# Patient Record
Sex: Female | Born: 1985 | Hispanic: Yes | Marital: Married | Smoking: Former smoker
Health system: Southern US, Community
[De-identification: ages and names within clinical notes are randomized; demographics above are authoritative.]

## PROBLEM LIST (undated history)

## (undated) DIAGNOSIS — N179 Acute kidney failure, unspecified: Secondary | ICD-10-CM

## (undated) DIAGNOSIS — D649 Anemia, unspecified: Secondary | ICD-10-CM

## (undated) DIAGNOSIS — R519 Headache, unspecified: Secondary | ICD-10-CM

## (undated) HISTORY — PX: NO PAST SURGERIES: SHX2092

---
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
## 2013-10-31 ENCOUNTER — Emergency Department (HOSPITAL_COMMUNITY)
Admission: EM | Admit: 2013-10-31 | Discharge: 2013-11-01 | Disposition: A | Discharge status: Home / Self Care | Payer: No Typology Code available for payment source | Attending: Emergency Medicine | Admitting: Emergency Medicine

## 2013-10-31 ENCOUNTER — Encounter (HOSPITAL_COMMUNITY): Payer: Self-pay | Admitting: Emergency Medicine

## 2013-10-31 DIAGNOSIS — R109 Unspecified abdominal pain: Secondary | ICD-10-CM

## 2013-10-31 DIAGNOSIS — Z3202 Encounter for pregnancy test, result negative: Secondary | ICD-10-CM | POA: Insufficient documentation

## 2013-10-31 DIAGNOSIS — R1013 Epigastric pain: Secondary | ICD-10-CM | POA: Insufficient documentation

## 2013-10-31 LAB — COMPREHENSIVE METABOLIC PANEL
ALBUMIN: 4.5 g/dL (ref 3.5–5.2)
ALK PHOS: 97 U/L (ref 39–117)
ALT: 40 U/L — ABNORMAL HIGH (ref 0–35)
AST: 32 U/L (ref 0–37)
BILIRUBIN TOTAL: 0.2 mg/dL — AB (ref 0.3–1.2)
BUN: 9 mg/dL (ref 6–23)
CHLORIDE: 101 meq/L (ref 96–112)
CO2: 27 meq/L (ref 19–32)
CREATININE: 0.66 mg/dL (ref 0.50–1.10)
Calcium: 9.8 mg/dL (ref 8.4–10.5)
GFR calc Af Amer: >90 mL/min (ref >90)
Glucose, Bld: 126 mg/dL — ABNORMAL HIGH (ref 70–99)
POTASSIUM: 5 meq/L (ref 3.7–5.3)
Sodium: 141 mEq/L (ref 137–147)
Total Protein: 8.1 g/dL (ref 6.0–8.3)

## 2013-10-31 LAB — CBC WITH DIFFERENTIAL/PLATELET
BASOS PCT: 0 % (ref 0–1)
Basophils Absolute: 0 10*3/uL (ref 0.0–0.1)
Eosinophils Absolute: 0.4 10*3/uL (ref 0.0–0.7)
Eosinophils Relative: 5 % (ref 0–5)
HCT: 41 % (ref 36.0–46.0)
HEMOGLOBIN: 14.7 g/dL (ref 12.0–15.0)
LYMPHS PCT: 44 % (ref 12–46)
Lymphs Abs: 3.6 10*3/uL (ref 0.7–4.0)
MCH: 30.7 pg (ref 26.0–34.0)
MCHC: 35.9 g/dL (ref 30.0–36.0)
MCV: 85.6 fL (ref 78.0–100.0)
MONO ABS: 0.7 10*3/uL (ref 0.1–1.0)
MONOS PCT: 8 % (ref 3–12)
NEUTROS ABS: 3.5 10*3/uL (ref 1.7–7.7)
NEUTROS PCT: 43 % (ref 43–77)
Platelets: 286 10*3/uL (ref 150–400)
RBC: 4.79 MIL/uL (ref 3.87–5.11)
RDW: 12.3 % (ref 11.5–15.5)
WBC: 8.2 10*3/uL (ref 4.0–10.5)

## 2013-10-31 LAB — LIPASE, BLOOD: LIPASE: 35 U/L (ref 11–59)

## 2013-10-31 MED ORDER — ONDANSETRON 4 MG PO TBDP
8.0000 mg | ORAL_TABLET | Freq: Once | ORAL | Status: AC
Start: 2013-10-31 — End: 2013-10-31
  Administered 2013-10-31: 8 mg via ORAL
  Filled 2013-10-31: qty 2

## 2013-10-31 NOTE — ED Notes (Signed)
Using telephone interpreter, pt states that she is having intermitten abd pain for one hour that comes and goes with nausea.  Pt states she had a similar episode one year prior and was told it was inflammation.  Pt states that she ate meat tonight, and while it normally does upset her stomach, it is much worse tonight.

## 2013-11-01 LAB — URINALYSIS, ROUTINE W REFLEX MICROSCOPIC
Bilirubin Urine: NEGATIVE
Glucose, UA: NEGATIVE mg/dL
Hgb urine dipstick: NEGATIVE
Ketones, ur: NEGATIVE mg/dL
Nitrite: NEGATIVE
Protein, ur: NEGATIVE mg/dL
Specific Gravity, Urine: 1.024 (ref 1.005–1.030)
Urobilinogen, UA: 0.2 mg/dL (ref 0.0–1.0)
pH: 7 (ref 5.0–8.0)

## 2013-11-01 LAB — PREGNANCY, URINE: Preg Test, Ur: NEGATIVE

## 2013-11-01 LAB — URINE MICROSCOPIC-ADD ON

## 2013-11-01 MED ORDER — FAMOTIDINE 20 MG PO TABS
20.0000 mg | ORAL_TABLET | Freq: Once | ORAL | Status: AC
  Administered 2013-11-01: 20 mg via ORAL
  Filled 2013-11-01: qty 1

## 2013-11-01 MED ORDER — GI COCKTAIL ~~LOC~~
30.0000 mL | Freq: Once | ORAL | Status: AC
  Administered 2013-11-01: 30 mL via ORAL
  Filled 2013-11-01: qty 30

## 2013-11-01 MED ORDER — FAMOTIDINE 20 MG PO TABS: 20.0000 mg | ORAL_TABLET | Freq: Two times a day (BID) | ORAL | Status: DC

## 2013-11-01 NOTE — ED Provider Notes (Signed)
CSN: [id number]     Arrival date & time 10/31/13  2201 History   None    Chief Complaint  Patient presents with  . Abdominal Pain     (Consider location/radiation/quality/duration/timing/severity/associated sxs/prior Treatment) HPI Comments: 28 year old female, history of no significant past medical history, she does not take any medications regularly including anti-inflammatories. She presents with a complaint of epigastric discomfort described as a aching and illness. It started approximately 5 hours ago, is a 5/10, nothing seems to make it better, worse after eating this evening. She had similar symptoms approximately one month ago. She has never had abdominal surgery, she states that it does radiate to the back occasionally. There is no nausea vomiting diarrhea blood in the stools, no recent fevers, no coughing shortness of breath or other complaints. She denies being pregnant though she has not had a period in several months. No history of abdominal surgery  Patient is a 28 y.o. female presenting with abdominal pain. The history is provided by the patient.  Abdominal Pain   History reviewed. No pertinent past medical history. History reviewed. No pertinent past surgical history. No family history on file. History  Substance Use Topics  . Smoking status: Never Smoker   . Smokeless tobacco: Not on file  . Alcohol Use: No   OB History   Grav Para Term Preterm Abortions TAB SAB Ect Mult Living                 Review of Systems  Gastrointestinal: Positive for abdominal pain.  All other systems reviewed and are negative.      Allergies  Chicken allergy and Morphine and related  Home Medications   Current Outpatient Rx  Name  Route  Sig  Dispense  Refill  . acetaminophen (TYLENOL) 325 MG tablet   Oral   Take 650 mg by mouth every 6 (six) hours as needed for fever or headache.         . famotidine (PEPCID) 20 MG tablet   Oral   Take 1 tablet (20 mg total) by mouth 2  (two) times daily.   30 tablet   0    BP 110/75  Pulse 77  Temp(Src) 97.9 F (36.6 C) (Oral)  Resp 22  Ht 5\' 3"  (1.6 m)  Wt 145 lb (65.772 kg)  BMI 25.69 kg/m2  SpO2 100% Physical Exam  Nursing note and vitals reviewed. Constitutional: She appears well-developed and well-nourished. No distress.  HENT:  Head: Normocephalic and atraumatic.  Mouth/Throat: Oropharynx is clear and moist. No oropharyngeal exudate.  Eyes: Conjunctivae and EOM are normal. Pupils are equal, round, and reactive to light. Right eye exhibits no discharge. Left eye exhibits no discharge. No scleral icterus.  Neck: Normal range of motion. Neck supple. No JVD present. No thyromegaly present.  Cardiovascular: Normal rate, regular rhythm, normal heart sounds and intact distal pulses.  Exam reveals no gallop and no friction rub.   No murmur heard. Pulmonary/Chest: Effort normal and breath sounds normal. No respiratory distress. She has no wheezes. She has no rales.  Abdominal: Soft. Bowel sounds are normal. She exhibits no distension and no mass. There is tenderness ( Mild tenderness in the epigastrium, no fullness, no guarding, no peritoneal signs).  Musculoskeletal: Normal range of motion. She exhibits no edema and no tenderness.  Lymphadenopathy:    She has no cervical adenopathy.  Neurological: She is alert. Coordination normal.  Skin: Skin is warm and dry. No rash noted. No erythema.  Psychiatric: She  has a normal mood and affect. Her behavior is normal.    ED Course  Procedures (including critical care time) Labs Review Labs Reviewed  COMPREHENSIVE METABOLIC PANEL - Abnormal; Notable for the following:    Glucose, Bld 126 (*)    ALT 40 (*)    Total Bilirubin 0.2 (*)    All other components within normal limits  URINALYSIS, ROUTINE W REFLEX MICROSCOPIC - Abnormal; Notable for the following:    APPearance CLOUDY (*)    Leukocytes, UA LARGE (*)    All other components within normal limits  URINE  MICROSCOPIC-ADD ON - Abnormal; Notable for the following:    Squamous Epithelial / LPF MANY (*)    Bacteria, UA FEW (*)    All other components within normal limits  CBC WITH DIFFERENTIAL  LIPASE, BLOOD  PREGNANCY, URINE   Imaging Review No results found.   EKG Interpretation None      MDM   Final diagnoses:  Abdominal pain    No CVA tenderness, normal vital signs, labs are unremarkable including normal blood counts, normal compressive metabolic panel and normal lipase. Urinalysis pending  With normal labs and improvement with medications with normal vital signs there is no indication for imaging at this time. No signs of liver dysfunction, no tenderness of the right upper quadrant, urinalysis likely a contaminant, culture will be sent.  Patient formed and in agreement with plan   Meds given in ED:  Medications  ondansetron (ZOFRAN-ODT) disintegrating tablet 8 mg (8 mg Oral Given 10/31/13 2220)  gi cocktail (Maalox,Lidocaine,Donnatal) (30 mLs Oral Given 11/01/13 0146)  famotidine (PEPCID) tablet 20 mg (20 mg Oral Given 11/01/13 0146)    New Prescriptions   FAMOTIDINE (PEPCID) 20 MG TABLET    Take 1 tablet (20 mg total) by mouth 2 (two) times daily.      Vida RollerBrian D Dimples Probus, MD 11/01/13 [phone]

## 2013-11-01 NOTE — Discharge Instructions (Signed)
Tests appear normal, no abnormalities of the blood work.  Take Pepcid nightly, return to the hospital for worsening symptoms, see resource as below.   Emergency Department Resource Guide 1) Find a Doctor and Pay Out of Pocket Although you won't have to find out who is covered by your insurance plan, it is a good idea to ask around and get recommendations. You will then need to call the office and see if the doctor you have chosen will accept you as a new patient and what types of options they offer for patients who are self-pay. Some doctors offer discounts or will set up payment plans for their patients who do not have insurance, but you will need to ask so you aren't surprised when you get to your appointment.  2) Contact Your Local Health Department Not all health departments have doctors that can see patients for sick visits, but many do, so it is worth a call to see if yours does. If you don't know where your local health department is, you can check in your phone book. The CDC also has a tool to help you locate your state's health department, and many state websites also have listings of all of their local health departments.  3) Find a Walk-in Clinic If your illness is not likely to be very severe or complicated, you may want to try a walk in clinic. These are popping up all over the country in pharmacies, drugstores, and shopping centers. They're usually staffed by nurse practitioners or physician assistants that have been trained to treat common illnesses and complaints. They're usually fairly quick and inexpensive. However, if you have serious medical issues or chronic medical problems, these are probably not your best option.  No Primary Care Doctor: - Call Health Connect at  [phone] - they can help you locate a primary care doctor that  accepts your insurance, provides certain services, etc. - Physician Referral Service- [phone]  Chronic Pain Problems: Organization          Address  Phone   Notes  [facility]  [phone] Patients need to be referred by their primary care doctor.   Medication Assistance: Organization         Address  Phone   Notes  [facility] Medication [facility] [address] [place], [place] [address] [phone] --Must be a resident of [place] [facility] [place] -- Must have NO insurance coverage whatsoever (no Medicaid/ Medicare, etc.) -- The pt. MUST have a primary care doctor that directs their care regularly and follows them in the community   MedAssist  [phone]) [phone]   [employer]  [phone]    Agencies that provide inexpensive medical care: Organization         Address  Phone   Notes  [facility]  [phone]   [facility]    [phone]   [facility] [address] [place], [place] [address] [phone]   [facility] [address], [place] [phone]) [phone]   [facility]anned Parenthood    [phone]   [facility]    [phone]   [facility]  [address], [place] Phone:  [phone], Fax:  [phone]) [phone] Hours of Operation:  9 am - 6 pm, M-F.  Also accepts Medicaid/Medicare and self-pay.  [facility]  [address], [place] Phone: (  [phone], Fax: [phone]. Hours of Operation:  8:30 am - 5:30 pm, M-F.  Also accepts Medicaid and self-pay.  [facility] [place] [address], [place] Phone: [phone]   [place], [place], [place] [phone], Ext. [phone] Mondays & Thursdays: 7-9 AM.  First 15 patients are seen on a first come, first serve basis.    [place] Providers:  Organization         Address  Phone   Notes  [facility] [address], [place] [phone] Also accepts self-pay patients.  [facility] [address], [place]  [phone]   [place], [address], [place] [phone]   [facility] [address] [place] [phone]   Lucianne Lei [address], [place]   [phone] Only accepts [place] Access [place] patients after they have their name applied to their card.   Self-Pay (no insurance) in [facility]:  Organization         Address  Phone   Notes  Sickle Cell Patients, [facility] [place] [phone]   [facility] [place] [phone]   [facility] [place]  [place], [place], [place] [phone]   [facility]/Dr. Osei-Bonsu  [address] [place] or [address], [place] [phone] Phone number for both [place] and [place] locations is the same.  Urgent Medical and [facility] [address], [place] [phone]   [facility] [address] [place] or [address] [phone]   [facility] [address] [place] [phone], phone; [phone], fax Sees patients 1st and 3rd Saturday of every month.  Must not qualify for public or private insurance (i.e. Medicaid, Medicare, [place] Health Choice, Veterans' Benefits)  Household income should be no more than 200% of the poverty level The clinic cannot treat you if you are pregnant or think you are pregnant  Sexually transmitted diseases are not treated at the clinic.    Dental Care: Organization         Address  Phone  Notes  [facility] Department of [facility] [place] [phone] Accepts children up to age 28 who are enrolled in [place] or [place]; pregnant women with a Medicaid card; and  children who have applied for Medicaid or [place] Health Choice, but were declined, whose parents can pay a reduced fee at time of service.  [facility] Department of [facility]  [address], [place] [phone] Accepts children up to age 15 who are enrolled in [place] or [place]; pregnant women with a Medicaid card; and children who have applied for Medicaid or [place] Health Choice, but were declined, whose parents can pay a reduced fee at time of service.  [place] Adult [facility] Access PROGRAM  [place] [phone] Patients are seen by appointment only. Walk-ins are not accepted. [place] will see patients 11 years of age and older. Monday - Tuesday (8am-5pm) Most Wednesdays (8:30-5pm) $30 per visit, cash only  [place] [facility]  [address],  [place] [phone] Patients are seen by appointment only. Walk-ins are not accepted. [place] will see patients 54 years of age and older. One Wednesday Evening (Monthly: Volunteer Based).  $30 per visit, cash only  [place]  [phone] for adults; Children under age 55, call Graduate Pediatric Dentistry at [phone]. Children aged 23-14, please call [phone] to request a pediatric application.  Dental services are provided in all areas of dental care including fillings, crowns and bridges, complete and partial dentures, implants, gum treatment, root canals, and extractions. Preventive care is also provided. Treatment is provided to both adults and children. Patients are selected via a lottery and there is often a waiting list.   [facility] [address] [place]  [phone] [email or web address]   [facility] [address] [place], [place] [phone], Ext. [phone] Second and Fourth Thursday of each month, opens at 6:30 AM; Clinic ends at 9 AM.  Patients are seen on a first-come first-served  basis, and a limited number are seen during each clinic.   [facility]  [address] [place], [place] [phone]   Eligibility Requirements You must have lived in [place], [place], or [place] counties for at least the last three months.   You cannot be eligible for state or federal sponsored [employer], including [employer], [place], or [employer].   You generally cannot be eligible for healthcare insurance through your employer.    How to apply: Eligibility screenings are held every Tuesday and Wednesday afternoon from 1:00 pm until 4:00 pm. You do not need an appointment for the interview!  [facility] [address] [place], [place]   [place]  [place] Department  [place]  [phone]    Behavioral Health Resources in the Community: Intensive Outpatient Programs Organization         Address  Phone  Notes  [place] Hunter. [address] [place], [place] [phone]   [facility] Outpatient [address], [place], [place]   ADS: Alcohol & Drug Svcs [address], [place], [place]   [place] [address],  [place], [place] or [phone]   Substance Abuse Resources Organization         Address  Phone  Notes  Alcohol and Drug Services  [phone]   [place]  [phone]   [facility] [place]   Chinita Pester  [phone]   Residential & Outpatient Substance Abuse Program  [phone]   Psychological Services Organization         Address  Phone  Notes  [facility] [place]  [place]  [phone]   [place] [address], [place] [employer] or [phone]    Mobile Crisis Teams Organization          Address  Phone  Notes  [facility], Mobile Crisis Care Unit  [phone]   [facility]  [address] [place], [place]   [place] Levels [address], [place] [phone]    Self-Help/Support Groups Organization         Address  Phone             Notes  [place] of [place] - variety of support  groups  [phone] Call for more information  Narcotics Anonymous (NA), [facility] [address], [employer] [place]  2 meetings at this location   Residential Facilities manager         Address  Phone  Notes  [facility] [place],    [place]  [phone]   [facility]  [address], [place] [id number], [place], [place]   [place], [place] [phone] Admissions: 8am-3pm M-F  Incentives Substance [place] [address]    [place], [place] [phone]   [facility] [address] [place], [place], [place]   [facility] [address]  [place], [place]   Insight Programs - Intensive Outpatient [address], [place], [place]   [facility] [address].,  [place], [place] [phone] or [phone]   [facility] Treatment Services (RTS) [address] [place], [place] Accepts Medicaid  [facility] [place] [address]  [place] [phone] Substance Abuse/Addiction Treatment   [facility] The Organization         Address  Phone  Notes  [facility]  [phone]   Domenic Schwab, PhD [address] Arlis Porta [place], [place]   [phone] or [phone]   [place], [place] [phone]   [facility] [address] [place], [place] [phone] Insurance/Medicaid/sponsorship  through [facility] and Families [address]                                    [place], [place] [phone] [place] [address], [place] [phone]    Dr. Adele Schilder  [phone]   [facility] Dept. 1) [address] [place] 2) [place] 3)  [place] [address], [place] [phone]  [phone]   [place] [phone] or [phone] (After Hours)

## 2013-11-02 LAB — URINE CULTURE

## 2014-01-27 ENCOUNTER — Encounter: Payer: Self-pay | Admitting: *Deleted

## 2014-02-17 ENCOUNTER — Ambulatory Visit (INDEPENDENT_AMBULATORY_CARE_PROVIDER_SITE_OTHER): Payer: No Typology Code available for payment source | Admitting: Obstetrics & Gynecology

## 2014-02-17 ENCOUNTER — Encounter: Payer: Self-pay | Admitting: Obstetrics & Gynecology

## 2014-02-17 VITALS — BP 114/78 | HR 85 | Temp 98.1°F | Ht 63.0 in | Wt 141.7 lb

## 2014-02-17 DIAGNOSIS — N949 Unspecified condition associated with female genital organs and menstrual cycle: Secondary | ICD-10-CM

## 2014-02-17 DIAGNOSIS — R102 Pelvic and perineal pain: Secondary | ICD-10-CM

## 2014-02-17 NOTE — Progress Notes (Signed)
Patent here today with complaint of irregular periods and left pelvic pain. Reports being on OCP to regulate period but admits that sometimes she only takes a couple pills and stops-- then gets frustrated because she is bleeding. States when she does take the pill pack as instructed her period only comes once a month. Reports if she is sitting for a long time she feels left sided lower abdominal/pelvic pain-- constant aching pain.

## 2014-02-17 NOTE — Progress Notes (Signed)
   CLINIC ENCOUNTER NOTE  History:  28 y.o. G0 here today for evaluation of irregular bleeding in the context of occasionally skipping her OCPs. Also reports constant left sided pelvic pain.   The following portions of the patient's history were reviewed and updated as appropriate: allergies, current medications, past family history, past medical history, past social history, past surgical history and problem list.  Review of Systems:  Pertinent items are noted in HPI.  Objective:  Physical Exam BP 114/78  Pulse 85  Temp(Src) 98.1 F (36.7 C) (Oral)  Ht 5\' 3"  (1.6 m)  Wt 141 lb 11.2 oz (64.275 kg)  BMI 25.11 kg/m2  LMP 01/25/2014 Gen: NAD Abd: Soft, mild left lower abdominal tenderness, nondistended Pelvic: Normal appearing external genitalia; normal appearing vaginal mucosa and cervix.  Normal discharge.  Small uterus, no other palpable masses, no uterine tenderness but has left adnexal tenderness on palpation   Assessment & Plan:  Advised to take OCPs correctly Pelvic ultrasound ordered for evaluation of pain, will follow up results    Jaynie CollinsUGONNA  Egbert Seidel, MD, FACOG Attending Obstetrician & Gynecologist Center for [facility], [facility]

## 2014-02-17 NOTE — Patient Instructions (Signed)
Regrese a la clinica cuando tenga su cita. Si tiene problemas o preguntas, llama a la clinica o vaya a la sala de emergencia al [facility].    

## 2014-02-26 ENCOUNTER — Ambulatory Visit (HOSPITAL_COMMUNITY): Admission: RE | Admit: 2014-02-26 | Payer: No Typology Code available for payment source | Source: Ambulatory Visit

## 2014-03-10 ENCOUNTER — Encounter (HOSPITAL_COMMUNITY): Payer: Self-pay | Admitting: Emergency Medicine

## 2019-01-16 ENCOUNTER — Other Ambulatory Visit: Payer: Self-pay

## 2019-01-16 ENCOUNTER — Other Ambulatory Visit (INDEPENDENT_AMBULATORY_CARE_PROVIDER_SITE_OTHER): Payer: Self-pay

## 2019-01-16 DIAGNOSIS — Z3481 Encounter for supervision of other normal pregnancy, first trimester: Secondary | ICD-10-CM

## 2019-01-16 LAB — POCT URINALYSIS DIP (MANUAL ENTRY)
Bilirubin, UA: NEGATIVE
Glucose, UA: NEGATIVE mg/dL
Ketones, POC UA: NEGATIVE mg/dL
Leukocytes, UA: NEGATIVE
Nitrite, UA: NEGATIVE
Spec Grav, UA: 1.025 (ref 1.010–1.025)
Urobilinogen, UA: 0.2 E.U./dL
pH, UA: 7.5 (ref 5.0–8.0)

## 2019-01-16 LAB — POCT UA - MICROSCOPIC ONLY

## 2019-01-18 LAB — URINE CULTURE, OB REFLEX: Organism ID, Bacteria: NO GROWTH

## 2019-01-18 LAB — CULTURE, OB URINE

## 2019-01-19 LAB — OBSTETRIC PANEL, INCLUDING HIV
Antibody Screen: NEGATIVE
Basophils Absolute: 0 10*3/uL (ref 0.0–0.2)
Basos: 0 %
EOS (ABSOLUTE): 0.1 10*3/uL (ref 0.0–0.4)
Eos: 1 %
HIV Screen 4th Generation wRfx: NONREACTIVE
Hematocrit: 36 % (ref 34.0–46.6)
Hemoglobin: 12 g/dL (ref 11.1–15.9)
Hepatitis B Surface Ag: NEGATIVE
Immature Grans (Abs): 0.2 10*3/uL — ABNORMAL HIGH (ref 0.0–0.1)
Immature Granulocytes: 2 %
Lymphocytes Absolute: 2.3 10*3/uL (ref 0.7–3.1)
Lymphs: 22 %
MCH: 30.5 pg (ref 26.6–33.0)
MCHC: 33.3 g/dL (ref 31.5–35.7)
MCV: 91 fL (ref 79–97)
Monocytes Absolute: 0.7 10*3/uL (ref 0.1–0.9)
Monocytes: 7 %
Neutrophils Absolute: 7.5 10*3/uL — ABNORMAL HIGH (ref 1.4–7.0)
Neutrophils: 68 %
Platelets: 254 10*3/uL (ref 150–450)
RBC: 3.94 x10E6/uL (ref 3.77–5.28)
RDW: 13.1 % (ref 11.7–15.4)
RPR Ser Ql: NONREACTIVE
Rh Factor: POSITIVE
Rubella Antibodies, IGG: <0.9 index — ABNORMAL LOW (ref >0.99)
WBC: 10.9 10*3/uL — ABNORMAL HIGH (ref 3.4–10.8)

## 2019-01-19 LAB — HGB FRAC. W/SOLUBILITY
Hgb A2 Quant: 2.5 % (ref 1.8–3.2)
Hgb A: 97.5 % (ref 96.4–98.8)
Hgb C: 0 %
Hgb F Quant: 0 % (ref 0.0–2.0)
Hgb S: 0 %
Hgb Solubility: NEGATIVE
Hgb Variant: 0 %

## 2019-01-23 ENCOUNTER — Encounter: Payer: Self-pay | Admitting: Family Medicine

## 2019-01-23 ENCOUNTER — Other Ambulatory Visit (HOSPITAL_COMMUNITY)
Admission: RE | Admit: 2019-01-23 | Discharge: 2019-01-23 | Disposition: A | Discharge status: Home / Self Care | Payer: Self-pay | Source: Ambulatory Visit | Attending: Family Medicine | Admitting: Family Medicine

## 2019-01-23 ENCOUNTER — Ambulatory Visit (INDEPENDENT_AMBULATORY_CARE_PROVIDER_SITE_OTHER): Payer: Self-pay | Admitting: Family Medicine

## 2019-01-23 ENCOUNTER — Other Ambulatory Visit: Payer: Self-pay

## 2019-01-23 VITALS — BP 108/78 | HR 101 | Wt 174.8 lb

## 2019-01-23 DIAGNOSIS — Z124 Encounter for screening for malignant neoplasm of cervix: Secondary | ICD-10-CM | POA: Insufficient documentation

## 2019-01-23 NOTE — Patient Instructions (Signed)
Segundo trimestre de embarazo  Second Trimester of Pregnancy  El segundo trimestre va desde la semana14 hasta la 27, desde el cuarto hasta el sexto mes, y suele ser el momento en el que mejor se siente. Su organismo se ha adaptado a estar embarazada, y comienza a sentirse fsicamente mejor. En general, las nuseas matutinas han disminuido o han desaparecido completamente, puede tener ms energa y un aumento de apetito. El segundo trimestre es tambin la poca en la que el feto se desarrolla rpidamente. Hacia el final del sexto mes, el feto mide aproximadamente 9pulgadas (23cm) y pesa alrededor de 1 libras (700g). Es probable que sienta que el beb se mueve (da pataditas) entre las 16 y 20semanas del embarazo.  Cambios en el cuerpo durante el segundo trimestre  Su cuerpo continua experimentando numerosos cambios durante su segundo trimestre. Estos cambios varan de una mujer a otra.   Seguir aumentando de peso. Notar que la parte baja del abdomen sobresale.   Podrn aparecer las primeras estras en las caderas, el abdomen y las mamas.   Es posible que tenga dolores de cabeza que pueden aliviarse con ciertos medicamentos. Los medicamentos que tome deben estar aprobados por el mdico.   Tal vez tenga necesidad de orinar con ms frecuencia porque el feto est ejerciendo presin sobre la vejiga.   Debido al embarazo podr sentir acidez estomacal con frecuencia.   Puede estar estreida, ya que ciertas hormonas enlentecen los movimientos de los msculos que empujan los desechos a travs de los intestinos.   Pueden aparecer hemorroides o abultarse e hincharse las venas (venas varicosas).   Puede sentir dolor en la espalda. Esto se debe a:  ? Aumento de peso.  ? Las hormonas del embarazo relajan las articulaciones en la pelvis.  ? Un cambio en el peso y los msculos que ayudan a mantener su equilibrio.   Sus pechos seguirn creciendo y se pondrn cada vez ms sensibles.   Las encas pueden sangrar y estar  sensibles al cepillado y al hilo dental.   Pueden aparecer zonas oscuras o manchas (cloasma, mscara del embarazo) en el rostro. Esto probablemente se atenuar despus del nacimiento del beb.   Es posible que se forme una lnea oscura desde el ombligo hasta la zona del pubis (linea nigra). Esto probablemente se atenuar despus del nacimiento del beb.   Tal vez haya cambios en el cabello. Esto cambios pueden incluir su engrosamiento, crecimiento rpido y cambios en la textura. Adems, a algunas mujeres se les cae el cabello durante o despus del embarazo, o tienen el cabello seco o fino. Lo ms probable es que el cabello se le normalice despus del nacimiento del beb.  Qu debe esperar en las visitas prenatales  Durante una visita prenatal de rutina:   La pesarn para asegurarse de que usted y el feto estn creciendo normalmente.   Le tomarn la presin arterial.   Le medirn el abdomen para controlar el desarrollo del beb.   Se escucharn los latidos cardacos fetales.   Se evaluarn los resultados de los estudios solicitados en visitas anteriores.  El mdico puede preguntarle lo siguiente:   Cmo se siente.   Si siente los movimientos del beb.   Si ha tenido sntomas anormales, como prdida de lquido, sangrado, dolores de cabeza intensos o clicos abdominales.   Si est consumiendo algn producto que contenga tabaco, como cigarrillos, tabaco de mascar y cigarrillos electrnicos.   Si tiene alguna pregunta.  Otros estudios que podrn realizarse durante el   segundo trimestre incluyen lo siguiente:   Anlisis de sangre para detectar lo siguiente:  ? Concentraciones de hierro bajas (anemia).  ? Nivel alto de azcar en la sangre que afecta a las mujeres embarazadas (diabetes gestacional) entre las semanas 24 y 28.  ? Anticuerpos Rh. Esto es para detectar una protena en los glbulos rojos (factor Rh).   Anlisis de orina para detectar infecciones, diabetes o protenas en la orina.   Una ecografa  para confirmar que el beb crece y se desarrolla correctamente.   Una amniocentesis para diagnosticar posibles problemas genticos.   Estudios del feto para descartar espina bfida y sndrome de Down.   Prueba del VIH (virus de inmunodeficiencia humana). Los exmenes prenatales de rutina incluyen la prueba de deteccin del VIH, a menos que decida no realizrsela.  Siga estas indicaciones en su casa:  Medicamentos   Siga las indicaciones del mdico en relacin con el uso de medicamentos. Durante el embarazo, hay medicamentos que pueden tomarse y otros que no.   Tome vitaminas prenatales que contengan por lo menos 600microgramos (?g) de cido flico.   Si est estreida, tome un laxante suave, si el mdico lo autoriza.  Qu debe comer y beber     Lleve una dieta equilibrada que incluya gran cantidad de frutas y verduras frescas, cereales integrales, buenas fuentes de protenas como carnes magras, huevos o tofu, y lcteos descremados. El mdico la ayudar a determinar la cantidad de peso que puede aumentar.   No coma carne cruda ni quesos sin cocinar. Estos elementos contienen grmenes que pueden causar defectos congnitos en el beb.   Si no consume muchos alimentos con calcio, hable con su mdico sobre si debera tomar un suplemento diario de calcio.   Limite el consumo de alimentos con alto contenido de grasas y azcares procesados, como alimentos fritos o dulces.   Para evitar el estreimiento:  ? Bebe suficiente lquido para mantener la orina clara o de color amarillo plido.  ? Consuma alimentos ricos en fibra, como frutas y verduras frescas, cereales integrales y frijoles.  Actividad   Haga ejercicio solamente como se lo haya indicado el mdico. La mayora de las mujeres pueden continuar su rutina de ejercicios durante el embarazo. Intente realizar como mnimo 30minutos de actividad fsica por lo menos 5das a la semana. Deje de hacer ejercicio si experimenta contracciones uterinas.   No levante  objetos pesados, use zapatos de tacones bajos y mantenga una buena postura.   Puede seguir manteniendo relaciones sexuales, a menos que el mdico le indique lo contrario.  Alivio del dolor y del malestar   Use un sostn que le brinde buen soporte para prevenir las molestias causadas por la sensibilidad en los pechos.   Dese baos de asiento con agua tibia para aliviar el dolor o las molestias causadas por las hemorroides. Use una crema para las hemorroides si el mdico la autoriza.   Descanse con las piernas elevadas si tiene calambres o dolor de cintura.   Si tiene venas varicosas, use medias de descanso. Eleve los pies durante 15minutos, 3 o 4veces por da. Limite el consumo de sal en su dieta.  Cuidados prenatales   Escriba sus preguntas. Llvelas cuando concurra a las visitas prenatales.   Concurra a todas las visitas prenatales tal como se lo haya indicado el mdico. Esto es importante.  Seguridad   Use el cinturn de seguridad en todo momento mientras conduce.   Haga una lista de los nmeros de telfono de emergencia, que   incluya los nmeros de telfono de familiares, amigos, el hospital y los departamentos de polica y bomberos.  Instrucciones generales   Pdale al mdico que la derive a clases de educacin prenatal en su localidad. Debe comenzar a tomar las clases antes de que empiece el mes6 de embarazo.   Pida ayuda si tiene necesidades nutricionales o de asesoramiento durante el embarazo. El mdico puede aconsejarla o derivarla a especialistas para que la ayuden con diferentes necesidades.   No se d baos de inmersin en agua caliente, baos turcos ni saunas.   No se haga duchas vaginales ni use tampones o toallas higinicas perfumadas.   No mantenga las piernas cruzadas durante mucho tiempo.   Evite el contacto con las bandejas sanitarias de los gatos y la tierra que estos animales usan. Estos elementos contienen bacterias que pueden causar defectos congnitos al beb y la posible  prdida del feto debido a un aborto espontneo o muerte fetal.   Evite fumar, consumir hierbas, beber alcohol y tomar frmacos que no le hayan recetado. Las sustancias qumicas que estos productos contienen pueden afectar la formacin y el desarrollo del beb.   No consuma ningn producto que contenga nicotina o tabaco, como cigarrillos y cigarrillos electrnicos. Si necesita ayuda para dejar de fumar, consulte al mdico.   Visite a su dentista si an no lo ha hecho durante el embarazo. Use un cepillo de dientes blando para higienizarse los dientes y psese el hilo dental con suavidad.  Comunquese con un mdico si:   Tiene mareos.   Siente clicos leves, presin en la pelvis o dolor persistente en el abdomen.   Tiene nuseas, vmitos o diarrea persistentes.   Observa una secrecin vaginal con mal olor.   Siente dolor al orinar.  Solicite ayuda de inmediato si:   Tiene fiebre.   Tiene una prdida de lquido por la vagina.   Tiene sangrado o pequeas prdidas vaginales.   Siente dolor intenso o clicos en el abdomen.   Sube de peso o baja de peso rpidamente.   Tiene dificultad para respirar y siente dolor de pecho.   Sbitamente se le hinchan mucho el rostro, las manos, los tobillos, los pies o las piernas.   No ha sentido los movimientos del beb durante una hora.   Siente un dolor de cabeza intenso que no se alivia al tomar medicamentos.   Nota cambios en la visin.  Resumen   El segundo trimestre va desde la semana14 hasta la 27, desde el cuarto hasta el sexto mes. Es tambin una poca en la que el feto se desarrolla rpidamente.   Su organismo atraviesa por muchos cambios durante el embarazo. Estos cambios varan de una mujer a otra.   Evite fumar, consumir hierbas, beber alcohol y tomar frmacos que no le hayan recetado. Estas sustancias qumicas afectan la formacin y el desarrollo de su beb.   No consuma ningn producto que contenga tabaco, lo que incluye cigarrillos, tabaco de mascar  y cigarrillos electrnicos. Si necesita ayuda para dejar de fumar, consulte al mdico.   Comunquese con su mdico si tiene preguntas sobre esto. Concurra a todas las visitas prenatales tal como se lo haya indicado el mdico. Esto es importante.  Esta informacin no tiene como fin reemplazar el consejo del mdico. Asegrese de hacerle al mdico cualquier pregunta que tenga.  Document Released: 05/09/2005 Document Revised: 12/10/2016 Document Reviewed: 12/10/2016  Elsevier Interactive Patient Education  2019 [employer]

## 2019-01-23 NOTE — Progress Notes (Signed)
Amy Welch is a 33 y.o. yo G1P0 at Unknown who presents for her initial prenatal visit. 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  Pregnancy is not planned. Not together with FOB. She does not know anything about his medical history.  Never been pregnant before.  UPT was positive on  5/13 at home. Went to a clinic and got serum HCG that as 8149 on 5/14. She also noticed fetal movement around 5/13.  She has irregular periods, last was October 2019.  No [place] yet.  Prior to this she had tried to get pregnant for 6 years with her now separated husband. Took some rx pills for trying to get pregnant.  Someone once told her she had endometriosis.  Used to smoke before positive UPT, not since.  Not working right now.  +financial strain - getting food assistance thru wic  She reports positive home pregnancy test and otherwise no sxs. She  is taking PNV. See flow sheet for details.  PMH, POBH, FH, meds, allergies and Social Hx reviewed. She reports no fam hx of any medical problems.   Prenatal Exam: Gen: Well nourished, well developed.  No distress.  Vitals noted. HEENT: Normocephalic, atraumatic.  Neck supple without cervical lymphadenopathy, thyromegaly or thyroid nodules.  Fair dentition. CV: RRR no murmur, gallops or rubs Lungs: CTAB.  Normal respiratory effort without wheezes or rales. Abd: soft, NTND. +BS.  Uterus not appreciated above pelvis. GU: Normal external female genitalia without lesions.  Normal vaginal, well rugated without lesions. No vaginal discharge.  Bimanual exam: No adnexal mass or TTP. No CMT.  Uterus size 24cm Ext: No clubbing, cyanosis or edema. Psych: Normal grooming and dress.  Not depressed or anxious appearing.  Normal thought content and process without flight of ideas or looseness of associations.  Assessment & Plan: 1) 33 y.o. yo G1P0 at ~20-25 via exam and FH doing well.  Current pregnancy issues include unknown dating. Dating is not reliable.  Based  on fundal height and informal bedside US, patient is likely 20-25weeks. Complete anatomy scan ordered.  Prenatal labs reviewed, notable for mildly elevated WBC. Genetic screening offered: too late. Early glucola is not indicated.  PHQ-9 and Pregnancy Medical Home forms completed and reviewed.  Bleeding and pain precautions reviewed. Importance of prenatal vitamins reviewed.  Follow up in 4 weeks. Once dating/anatomy scan is completed, please update pregnancy episode.  At next visit, please ask mom about pre pregnancy weight so we can use this for plotting pregnancy weight gain.   Ralene Ok, MD

## 2019-01-27 LAB — CYTOLOGY - PAP
Chlamydia: POSITIVE — AB
Diagnosis: NEGATIVE
HPV: NOT DETECTED
Neisseria Gonorrhea: NEGATIVE

## 2019-01-28 ENCOUNTER — Other Ambulatory Visit: Payer: Self-pay

## 2019-01-28 ENCOUNTER — Ambulatory Visit (INDEPENDENT_AMBULATORY_CARE_PROVIDER_SITE_OTHER): Payer: Self-pay | Admitting: *Deleted

## 2019-01-28 DIAGNOSIS — A749 Chlamydial infection, unspecified: Secondary | ICD-10-CM

## 2019-01-28 MED ORDER — AZITHROMYCIN 500 MG PO TABS
1000.0000 mg | ORAL_TABLET | Freq: Once | ORAL | Status: AC
  Administered 2019-01-28: 1000 mg via ORAL

## 2019-01-28 MED ORDER — AZITHROMYCIN 500 MG PO TABS: 500.0000 mg | ORAL_TABLET | Freq: Once | ORAL | Status: DC

## 2019-01-28 NOTE — Progress Notes (Signed)
Patient in nurse clinic today for STD treatment of Chlamydia.    Patient advised to abstain from sex for 7-10 days after treatment or when partner has been tested/treated.    Azithromycin 1 GM PO x 1 given and Ceftriaxone 250 mg IM x 1 given in per Dr. Rosario Jacks orders.  Patient observed 15 minutes in office.  No reaction noted.   Patient to follow up in 4 weeks for re-screening.    Provided condoms and advised to use with all sexual activity. Patient verbalized understanding.   STD report form fax completed and faxed to [facility] Department at [phone] (STD department).     Christen Bame, CMA   [employer] interpretor HU#:[id number]  Name: Amy Welch

## 2019-02-02 ENCOUNTER — Encounter: Payer: Self-pay | Admitting: Family Medicine

## 2019-02-02 DIAGNOSIS — Z2839 Other underimmunization status: Secondary | ICD-10-CM | POA: Insufficient documentation

## 2019-02-02 DIAGNOSIS — Z283 Underimmunization status: Secondary | ICD-10-CM | POA: Insufficient documentation

## 2019-02-04 ENCOUNTER — Telehealth: Payer: Self-pay | Admitting: Family Medicine

## 2019-02-04 NOTE — Telephone Encounter (Signed)
Received [facility] [place] results - dating is about 30w. She had her initial OB with me last week. I have discussed w Dr. Owens Shark and we will reschedule her for the 11:10 appt on Dr. Saul Fordyce schedule on 6/29. She will need a spanish interpreter and is adopt a mom - does anyone know how to notify [place]?   I will place [place] results in Dr. Saul Fordyce box because if I place in batch scanning they may not be available by next week.   Additionally, there may be duplicitious charts for this patient - when I search Lynsey Ange with BD [id number] (this is listed on her [facility] [place] results), that is a separate chart. Please clarify with patient at next visit.

## 2019-02-04 NOTE — Telephone Encounter (Signed)
LVM to have pt call office to inform her of below.  Please let her know that the appointment is actually at 11:30am not 11:10am. Amy Welch, CMA

## 2019-02-05 NOTE — Telephone Encounter (Addendum)
Pt informed of below using [employer] [facility]#[id number]. April Zimmerman Rumple, CMA

## 2019-02-08 ENCOUNTER — Encounter: Payer: Self-pay | Admitting: Family Medicine

## 2019-02-08 DIAGNOSIS — Z3483 Encounter for supervision of other normal pregnancy, third trimester: Secondary | ICD-10-CM | POA: Insufficient documentation

## 2019-02-09 ENCOUNTER — Ambulatory Visit (INDEPENDENT_AMBULATORY_CARE_PROVIDER_SITE_OTHER): Payer: Self-pay | Admitting: Family Medicine

## 2019-02-09 ENCOUNTER — Other Ambulatory Visit: Payer: Self-pay

## 2019-02-09 ENCOUNTER — Encounter (HOSPITAL_COMMUNITY): Payer: Self-pay

## 2019-02-09 ENCOUNTER — Encounter: Payer: Self-pay | Admitting: Family Medicine

## 2019-02-09 ENCOUNTER — Encounter (HOSPITAL_COMMUNITY): Payer: Self-pay | Admitting: Emergency Medicine

## 2019-02-09 VITALS — BP 116/62 | HR 113 | Wt 176.6 lb

## 2019-02-09 DIAGNOSIS — Z3483 Encounter for supervision of other normal pregnancy, third trimester: Secondary | ICD-10-CM

## 2019-02-09 DIAGNOSIS — O321XX0 Maternal care for breech presentation, not applicable or unspecified: Secondary | ICD-10-CM | POA: Insufficient documentation

## 2019-02-09 LAB — POCT 1 HR PRENATAL GLUCOSE: Glucose 1 Hr Prenatal, POC: 193 mg/dL

## 2019-02-09 NOTE — Progress Notes (Signed)
  Patient Name: Amy Welch Date of Birth: [birth year] Date of Visit: 02/09/19 PCP: Sela Hilding, MD  Chief Complaint: prenatal care The patient non-English speaking. An interpreter was used for the entire visit.  Subjective: Amy Welch is a pleasant G1P0 at  [redacted]w[redacted]d dated by third trimester ultrasound.  The patient reports she did not know she was pregnant until 5 months into her pregnancy.  She is uncertain of her LMP.  Her best dating is by a 30-week ultrasound.     Pregnancy has been uncomplicated.  She has no significant medical history.  She denies any significant family history.  She is unsure of her prepregnancy weight and actually thinks she may have weight 190 pounds at the beginning of her pregnancy.  Her weight today is 176 pounds.  She is gained 2 pounds since her prior visit.   She has no unusual complaints today. Reports good fetal movement. Denies contractions, loss of fluid, or bleeding.     ROS:  ROS  I have reviewed the patient's medical, surgical, family, and social history as appropriate.   Pertinent PMH: None  Prior Obstetric History: First pregnancy, unintended   Complications of Current Pregnancy: None    Vitals:   02/09/19 1138  BP: 116/62  Pulse: (!) 113   Last Weight  Most recent update: 02/09/2019 11:38 AM   Weight  80.1 kg (176 lb 9.6 oz)           Pregravid weight not on file Could not be calculated Filed Weights   02/09/19 1138  Weight: 176 lb 9.6 oz (80.1 kg)    .HR on repeat 96 FHT 140 FH 28- slightly less than dates   Timmya was seen today for routine prenatal visit.  Diagnoses and all orders for this visit:  Encounter for supervision of other normal pregnancy, third trimester -     POCT 1 Hr Prenatal Glucose   Uterine Size Discrepancy, recently had ultrasound performed.  She needs a follow-up ultrasound in 4 weeks.  I discussed this with her today.  Will route to Dr. Maudie Mercury who is seeing the patient next.   Recommend she order an adoptive mom ultrasound at her follow-up visit for both growth as well as follow-up for visualization of the fetal heart which was not well visualized.  Chlamydia infection She received treatment 10 days ago.  With current CDC guidelines recommend test of cure at next visit, 4 weeks from date of treatment.  She will also need a repeat test around the time of delivery when GBS is collected.   Elevated 1 hour GTT three hour scheduled for next day. Given that false positive rate is higher later in pregnancy, will obtain 3 hour.    Anticipatory Guidance and Prenatal Education provided on the following topics: - Preterm labor signs- discussed at length  - Discussed contraception- unsure - Discussed safe sleep, crib, car seat, and how to get to L and D  - Reasons to present to [facility]U - Nutrition in pregnancy   At next visit: Needs ultrasound ordered for follow up (fetal cardiac assessment) and repeat GC/CT testing. Should also have another GC/CT at 36-37 weeks when GBS   Dorris Singh, MD  [facility] Medicine Teaching Service

## 2019-02-09 NOTE — Patient Instructions (Addendum)
It was wonderful to see you today.  Thank you for choosing [facility].   Please call [phone] with any questions about today's appointment.  Please be sure to schedule follow up at the front  desk before you leave today.   Dorris Singh, MD  Family Medicine    Tercer trimestre de embarazo Third Trimester of Pregnancy  El tercer trimestre comprende desde la UMPNTI14 hasta la ERXVQM08 (desde el mes7 hasta el mes9). En este trimestre, el beb en gestacin (feto) crece muy rpidamente. Hacia el final del noveno mes, el beb en gestacin mide alrededor de 20pulgadas (45cm) de largo. Pesa entre 6y 10libras [phone]). Siga estas indicaciones en su casa: Medicamentos  [employer] de venta libre y los recetados solamente como se lo haya indicado el mdico. Algunos medicamentos son seguros para tomar durante el Media planner y otros no lo son.  Tome vitaminas prenatales que contengan por lo menos [id number] (?g) de cido flico.  Si tiene dificultad para mover el intestino (estreimiento), tome un medicamento para ablandar las heces (laxante) si su mdico se lo autoriza. Comida y bebida   Ingiera alimentos saludables de [place] regular.  No coma carne cruda ni quesos sin cocinar.  Si obtiene poca cantidad de calcio de los alimentos que ingiere, consulte a su mdico sobre la posibilidad de tomar un suplemento diario de calcio.  La ingesta diaria de cuatro o cinco comidas pequeas en lugar de tres comidas abundantes.  Evite el consumo de alimentos ricos en grasas y azcares, como los alimentos fritos y los dulces.  Para evitar el estreimiento: ? Consuma alimentos ricos en fibra, como frutas y verduras frescas, cereales integrales y frijoles. ? Beba suficiente lquido para mantener el pis (orina) claro o de color amarillo plido. Actividad  Haga ejercicios solamente como se lo haya indicado el mdico. Interrumpa la actividad fsica si  comienza a tener calambres.  No levante objetos pesados, use zapatos de tacones bajos y sintese derecha.  No haga ejercicio si hace demasiado calor, hay demasiada humedad o se encuentra en un lugar de mucha altura (altitud alta).  Puede continuar teniendo [employer], a menos que el mdico le indique lo contrario. Alivio del dolor y del Tree surgeon  Use un sostn que le brinde buen soporte si sus mamas estn sensibles.  Haga pausas frecuentes y descanse con las piernas levantadas si tiene calambres en las piernas o dolor en la zona lumbar.  Dese baos de asiento con agua tibia para Best boy o las molestias causadas por las hemorroides. Use una crema para las hemorroides si el mdico la autoriza.  Si desarrolla venas hinchadas y abultadas (vrices) en las piernas: ? Use medias de compresin o medias de descanso como se lo haya indicado el mdico. ? Levante (eleve) los pies durante 66minutos, 3 o 4veces por Training and development officer. ? Limite el consumo de sal en sus alimentos. Seguridad  [employer] cinturn de seguridad cuando conduzca.  Haga una lista de los nmeros de telfono de Freight forwarder, que [employer] nmeros de telfono de familiares, amigos, [facility], as como los departamentos de polica y bomberos. Preparacin para la llegada del beb Para prepararse para la llegada de su beb:  Tome clases prenatales.  Practique ir [employer] al hospital.  Lasting [facility] y recorra el rea de maternidad.  Hable en su trabajo acerca de tomar licencia cuando llegue el beb.  Prepare el bolso que llevar al hospital.  Prepare la habitacin del beb.  Concurra  los controles mdicos.  Compre un asiento de seguridad orientado hacia atrs para llevar al beb en el automvil. Aprenda cmo instalarlo en el auto. Instrucciones generales  No se d baos de inmersin en agua caliente, baos turcos ni saunas.  No consuma ningn producto que contenga nicotina o tabaco, como cigarrillos y  cigarrillos electrnicos. Si necesita ayuda para dejar de fumar, consulte al mdico.  No beba alcohol.  No se haga duchas vaginales ni use tampones o toallas higinicas perfumadas.  No mantenga las piernas cruzadas durante mucho tiempo.  No haga viajes de larga distancia, excepto si es obligatorio. Hgalos solamente si su mdico la autoriza.  Visite a su dentista si no lo ha hecho durante el embarazo. Use un cepillo de cerdas suaves para cepillarse los dientes. Psese el hilo dental con suavidad.  Evite el contacto con las bandejas sanitarias de los gatos y la tierra que estos animales usan. Estos elementos contienen bacterias que pueden causar defectos congnitos al beb y la posible prdida del beb (aborto espontneo) o la muerte fetal.  Concurra a todas las visitas prenatales como se lo haya indicado el mdico. Esto es importante. Comunquese con un mdico si:  No est segura de si est en trabajo de parto o si ha roto la bolsa de las aguas.  Tiene mareos.  Tiene clicos leves o siente presin en la parte baja del vientre.  Sufre un dolor persistente en el abdomen.  Sigue teniendo malestar estomacal, vomita o tiene heces lquidas.  Advierte un lquido con olor ftido que proviene de la vagina.  Siente dolor al orinar. Solicite ayuda de inmediato si:  Tiene fiebre.  Tiene una prdida de lquido por la vagina.  Tiene sangrado o pequeas prdidas vaginales.  Siente dolor intenso o clicos en el abdomen.  Aumenta o baja de peso rpidamente.  Tiene dificultades para recuperar el aliento y siente dolor en el pecho.  Sbitamente se le hinchan mucho el rostro, las manos, los tobillos, los pies o las piernas.  No ha sentido los movimientos del beb durante una hora.  Siente un dolor de cabeza intenso que no se alivia con medicamentos.  Tiene dificultad para ver.  Tiene prdida de lquido o le sale un chorro de lquido de la vagina antes de estar en la semana  37.  Tiene espasmos abdominales (contracciones) regulares antes de estar en la semana 37. Resumen  El tercer trimestre comprende desde la semana28 hasta la semana40 (desde el mes7 hasta el mes9). Esta es la poca en que el beb en gestacin crece muy rpidamente.  Siga los consejos del mdico con respecto a los medicamentos, la alimentacin y la actividad.  Preprese para la llegada del beb tomando las clases prenatales, preparando todo lo que necesitar el beb, arreglando la habitacin del beb y concurriendo a los controles mdicos.  Solicite ayuda de inmediato si tiene sangrado por la vagina, siente dolor en el pecho o tiene dificultad para respirar, o si no ha sentido que su beb se mueve en el transcurso de ms de una hora. Esta informacin no tiene como fin reemplazar el consejo del mdico. Asegrese de hacerle al mdico cualquier pregunta que tenga. Document Released: 04/01/2013 Document Revised: 03/04/2017 Document Reviewed: 03/04/2017 Elsevier Patient Education  2020 Elsevier Inc.  

## 2019-02-10 ENCOUNTER — Other Ambulatory Visit: Payer: Self-pay

## 2019-02-10 ENCOUNTER — Encounter: Payer: Self-pay | Admitting: Family Medicine

## 2019-02-10 ENCOUNTER — Other Ambulatory Visit (INDEPENDENT_AMBULATORY_CARE_PROVIDER_SITE_OTHER): Payer: Self-pay

## 2019-02-10 DIAGNOSIS — Z3483 Encounter for supervision of other normal pregnancy, third trimester: Secondary | ICD-10-CM

## 2019-02-10 LAB — CBC
Hematocrit: 37.3 % (ref 34.0–46.6)
Hemoglobin: 12.6 g/dL (ref 11.1–15.9)
MCH: 30.4 pg (ref 26.6–33.0)
MCHC: 33.8 g/dL (ref 31.5–35.7)
MCV: 90 fL (ref 79–97)
Platelets: 275 10*3/uL (ref 150–450)
RBC: 4.14 x10E6/uL (ref 3.77–5.28)
RDW: 12.9 % (ref 11.7–15.4)
WBC: 10.5 10*3/uL (ref 3.4–10.8)

## 2019-02-10 LAB — POCT CBG (FASTING - GLUCOSE)-MANUAL ENTRY: Glucose Fasting, POC: 111 mg/dL — AB (ref 70–99)

## 2019-02-10 LAB — HIV ANTIBODY (ROUTINE TESTING W REFLEX): HIV Screen 4th Generation wRfx: NONREACTIVE

## 2019-02-10 LAB — RPR: RPR Ser Ql: NONREACTIVE

## 2019-02-10 NOTE — Progress Notes (Signed)
Informed patient of results during 3 hour test.   Dorris Singh, MD  [facility] Medicine Teaching Service

## 2019-02-11 LAB — GESTATIONAL GLUCOSE TOLERANCE
Glucose, Fasting: 147 mg/dL — ABNORMAL HIGH (ref 65–94)
Glucose, GTT - 1 Hour: 97 mg/dL (ref 65–179)
Glucose, GTT - 2 Hour: 146 mg/dL (ref 65–154)
Glucose, GTT - 3 Hour: 122 mg/dL (ref 65–139)

## 2019-02-12 ENCOUNTER — Telehealth: Payer: Self-pay | Admitting: Family Medicine

## 2019-02-12 ENCOUNTER — Encounter: Payer: Self-pay | Admitting: Family Medicine

## 2019-02-12 NOTE — Telephone Encounter (Signed)
Attempted to call, left message with Spanish interpreter to call back regarding results.  Nursing- if patient calls back, 3 hour test was 'normal'. She had only one elevated value, she does NOT have gestational diabetes.  Dorris Singh, MD  Family Medicine Teaching Service

## 2019-02-23 ENCOUNTER — Encounter: Payer: Self-pay | Admitting: Family Medicine

## 2019-02-24 ENCOUNTER — Ambulatory Visit (INDEPENDENT_AMBULATORY_CARE_PROVIDER_SITE_OTHER): Payer: Self-pay | Admitting: Family Medicine

## 2019-02-24 ENCOUNTER — Other Ambulatory Visit: Payer: Self-pay

## 2019-02-24 ENCOUNTER — Other Ambulatory Visit (HOSPITAL_COMMUNITY)
Admission: RE | Admit: 2019-02-24 | Discharge: 2019-02-24 | Disposition: A | Discharge status: Home / Self Care | Payer: Self-pay | Source: Ambulatory Visit | Attending: Family Medicine | Admitting: Family Medicine

## 2019-02-24 ENCOUNTER — Encounter: Payer: Self-pay | Admitting: Family Medicine

## 2019-02-24 VITALS — BP 108/68 | HR 99 | Wt 179.2 lb

## 2019-02-24 DIAGNOSIS — O368190 Decreased fetal movements, unspecified trimester, not applicable or unspecified: Secondary | ICD-10-CM | POA: Insufficient documentation

## 2019-02-24 DIAGNOSIS — A749 Chlamydial infection, unspecified: Secondary | ICD-10-CM | POA: Insufficient documentation

## 2019-02-24 DIAGNOSIS — Z3483 Encounter for supervision of other normal pregnancy, third trimester: Secondary | ICD-10-CM

## 2019-02-24 DIAGNOSIS — Z789 Other specified health status: Secondary | ICD-10-CM | POA: Insufficient documentation

## 2019-02-24 DIAGNOSIS — O368130 Decreased fetal movements, third trimester, not applicable or unspecified: Secondary | ICD-10-CM

## 2019-02-24 DIAGNOSIS — O98819 Other maternal infectious and parasitic diseases complicating pregnancy, unspecified trimester: Secondary | ICD-10-CM

## 2019-02-24 DIAGNOSIS — O26843 Uterine size-date discrepancy, third trimester: Secondary | ICD-10-CM | POA: Insufficient documentation

## 2019-02-24 DIAGNOSIS — O321XX0 Maternal care for breech presentation, not applicable or unspecified: Secondary | ICD-10-CM

## 2019-02-24 NOTE — Patient Instructions (Addendum)
Dear Amy Welch,   It was good to see you! Thank you for taking your time to come in to be seen. Today, we discussed the following:   Pregnancy   Please obtain Ultrasound to check growth  We will call you about any abnormal results  Please return in 2 weeks for follow up between July 29th through August 5th  Be well,   Zettie Cooley, M.D   [place] [phone]  *Sign up for MyChart for instant access to your health profile, labs, orders, upcoming appointments or to contact your provider with questions*  ==========================================================================   For any pregnancy-related emergencies after 5:30am on October 05, 2018, please go to the Maternity Admissions Unit in the [place] [facility] [place] will use hospital Entrance C.

## 2019-02-24 NOTE — Assessment & Plan Note (Signed)
Patient notes that there are about 2 days out of the week where she does not feel baby moving as much.  When she attempts to stimulate, he responds with movement.  Patient is active on exam today.  Mom denies decreased fetal movement today. - Directions to MAU -Patient told to go to MAU if she does not feel fetal movement to be evaluated -Patient given handout about counting kicks

## 2019-02-24 NOTE — Progress Notes (Signed)
  Patient Name: Amy Welch Date of Birth: May 23, 1986 Date of Visit: 02/24/19 PCP: Wilber Oliphant, MD  Chief Complaint: prenatal care  Subjective: Bryon Lions Vela Prose is a pleasant G1P0000 at  33&6 dated by 30 wk U/S. Reports decreased fetal movement up to two times a week. She reports that when she stimulates her belly, she feels him move, but worries about decreased movement on these days. Denies contractions, loss of fluid, or bleeding. She otherwise has no unusual complaints today.   ROS: See HPI  I have reviewed the patient's medical, surgical, family, and social history as appropriate.  Pertinent PMH: Chlamydia in pregnancy.  Prior Obstetric History: none Complications of Current Pregnancy: Chlamydia in pregnancy, rubella non-immune, Spanish speaking  Vitals:   02/24/19 0941  BP: 108/68  Pulse: 99   Last Weight  Most recent update: 02/24/2019  9:43 AM   Weight  81.3 kg (179 lb 3.2 oz)           Pregravid weight not on file Could not be calculated Filed Weights   02/24/19 0941  Weight: 179 lb 3.2 oz (81.3 kg)   FHR: 130 Fundal Height: 26cm  A/P Lenay was seen today for routine prenatal visit.  Engagement of fetus in breech position Breech resolved. Transverse Lie on U/S today.  - will continue to follow with u/s  Chlamydia infection during pregnancy Test of cure today, greater than 3 weeks since treatment. -We will follow-up repeat GC at 36 weeks  Uterine size date discrepancy pregnancy, third trimester Previously noted.  On exam today, transverse lie (belly down and head to patient's right) and fundal height measures 26 cm.  Decreased fetal movement Patient notes that there are about 2 days out of the week where she does not feel baby moving as much.  When she attempts to stimulate, he responds with movement.  Patient is active on exam today.  Mom denies decreased fetal movement today. - Directions to MAU -Patient told to go to MAU if she does not  feel fetal movement to be evaluated -Patient given handout about counting kicks    Encounter for supervision of other normal pregnancy, third trimester At next visit:  - review growth scan  - repeat G/C - GBS - Review contraception and circumcision    Anticipatory Guidance and Prenatal Education provided on the following topics: - Preterm labor signs  - Reasons to present to MAU & map to MAU - Nutrition in pregnancy - Contraception postpartum  - Breastfeed  Zettie Cooley, MD  [facility] Medicine Teaching Service

## 2019-02-24 NOTE — Assessment & Plan Note (Addendum)
Breech resolved. Transverse Lie on U/S today.  - will continue to follow with u/s

## 2019-02-24 NOTE — Assessment & Plan Note (Addendum)
Previously noted.  On exam today, transverse lie (belly down and head to patient's right) and fundal height measures 26 cm.

## 2019-02-24 NOTE — Assessment & Plan Note (Signed)
At next visit:  - review growth scan  - repeat G/C - GBS - Review contraception and circumcision

## 2019-02-24 NOTE — Assessment & Plan Note (Signed)
Test of cure today, greater than 3 weeks since treatment. -We will follow-up repeat GC at 36 weeks

## 2019-02-25 LAB — CERVICOVAGINAL ANCILLARY ONLY
Chlamydia: NEGATIVE
Neisseria Gonorrhea: NEGATIVE

## 2019-03-16 ENCOUNTER — Encounter: Payer: Self-pay | Admitting: Family Medicine

## 2019-03-20 ENCOUNTER — Other Ambulatory Visit: Payer: Self-pay

## 2019-03-20 ENCOUNTER — Inpatient Hospital Stay (HOSPITAL_COMMUNITY)
Admission: AD | Admit: 2019-03-20 | Discharge: 2019-03-20 | Disposition: A | Discharge status: Home / Self Care | Payer: Self-pay | Attending: Obstetrics and Gynecology | Admitting: Obstetrics and Gynecology

## 2019-03-20 DIAGNOSIS — A749 Chlamydial infection, unspecified: Secondary | ICD-10-CM

## 2019-03-20 DIAGNOSIS — O479 False labor, unspecified: Secondary | ICD-10-CM

## 2019-03-20 DIAGNOSIS — Z3A38 38 weeks gestation of pregnancy: Secondary | ICD-10-CM | POA: Insufficient documentation

## 2019-03-20 DIAGNOSIS — O471 False labor at or after 37 completed weeks of gestation: Secondary | ICD-10-CM | POA: Insufficient documentation

## 2019-03-20 DIAGNOSIS — O368130 Decreased fetal movements, third trimester, not applicable or unspecified: Secondary | ICD-10-CM

## 2019-03-20 NOTE — MAU Note (Signed)
I have communicated with Dr. Janus Molder and reviewed vital signs:  Vitals:   03/20/19 1443 03/20/19 1702  BP: 122/82 (!) 111/58  Pulse: 82 68  Resp: 18 18  Temp: 98.4 F (36.9 C)     Vaginal exam:  Effacement (%): 30 Cervical Position: Posterior Station: Ballotable Exam by:: K.Kolbie Lepkowski,RN,   Also reviewed contraction pattern and that non-stress test is reactive.  It has been documented that patient is contracting every 30 minutes with fingertip cervical dialtion. Normal bloody show observed. Patient denies any other complaints.  Based on this report provider has given order for discharge.  A discharge order and diagnosis entered by a provider.   Labor discharge instructions reviewed with patient.

## 2019-03-20 NOTE — Discharge Instructions (Signed)
Contracciones de Braxton Hicks Braxton Hicks Contractions Las contracciones del tero pueden presentarse durante todo el embarazo, pero no siempre indican que la mujer est de parto. Es posible que usted haya tenido contracciones de prctica llamadas "contracciones de Braxton Hicks". A veces, se las confunde con el parto real. Qu son las contracciones de Braxton Hicks? Las contracciones de Braxton Hicks son espasmos que se producen en los msculos del tero antes del parto. A diferencia de las contracciones del parto verdadero, estas no producen el agrandamiento (la dilatacin) ni el afinamiento del cuello uterino. Hacia el final del embarazo (entre las semanas 32y34), las contracciones de Braxton Hicks pueden presentarse ms seguido y tornarse ms intensas. A veces, resulta difcil distinguirlas del parto verdadero porque pueden ser muy molestas. No debe sentirse avergonzada si concurre al hospital con falso parto. En ocasiones, la nica forma de saber si el trabajo de parto es verdadero es que el mdico determine si hay cambios en el cuello del tero. El mdico le har un examen fsico y quizs le controle las contracciones. Si usted no est de parto verdadero, el examen debe indicar que el cuello uterino no est dilatado y que usted no ha roto bolsa. Si no hay otros problemas de salud asociados con su embarazo, no habr inconvenientes si la envan a su casa con un falso parto. Es posible que las contracciones de Braxton Hicks continen hasta que se desencadene el parto verdadero. Cmo diferenciar el trabajo de parto falso del verdadero Trabajo de parto verdadero  Las contracciones duran de 30a70segundos.  Las contracciones pueden tornarse muy regulares.  La molestia generalmente se siente en la parte superior del tero y se extiende hacia la zona baja del abdomen y hacia la cintura.  Las contracciones no desaparecen cuando usted camina.  Las contracciones generalmente se hacen ms  intensas y aumentan en frecuencia.  El cuello uterino se dilata y se afina. Parto falso  En general, las contracciones son ms cortas y no tan intensas como las del parto verdadero.  En general, las contracciones son irregulares.  A menudo, las contracciones se sienten en la parte delantera de la parte baja del abdomen y en la ingle.  Las contracciones pueden desaparecer cuando usted camina o cambia de posicin mientras est acostada.  Las contracciones se vuelven ms dbiles y su duracin es menor a medida que transcurre el tiempo.  En general, el cuello uterino no se dilata ni se afina. Siga estas indicaciones en su casa:   Tome los medicamentos de venta libre y los recetados solamente como se lo haya indicado el mdico.  Contine haciendo los ejercicios habituales y siga las dems indicaciones que el mdico le d.  Coma y beba con moderacin si cree que est de parto.  Si las contracciones de Braxton Hicks le provocan incomodidad: ? Cambie de posicin: si est acostada o descansando, camine; si est caminando, descanse. ? Sintese y descanse en una baera con agua tibia. ? Beba suficiente lquido como para mantener la orina de color amarillo plido. La deshidratacin puede provocar contracciones. ? Respire lenta y profundamente varias veces por hora.  Vaya a todas las visitas de control prenatales y de control como se lo haya indicado el mdico. Esto es importante. Comunquese con un mdico si:  Tiene fiebre.  Siente dolor constante en el abdomen. Solicite ayuda de inmediato si:  Las contracciones se intensifican, se hacen ms regulares y cercanas entre s.  Tiene una prdida de lquido por la vagina.  Elimina   una mucosidad sanguinolenta (prdida del tapn mucoso).  Tiene una hemorragia vaginal.  Tiene un dolor en la zona lumbar que nunca tuvo antes.  Siente que la cabeza del beb empuja hacia abajo y ejerce presin en la zona plvica.  El beb no se mueve tanto  como antes. Resumen  Las contracciones que se presentan antes del parto se conocen como contracciones de Braxton Hicks, falso parto o contracciones de prctica.  En general, las contracciones de Braxton Hicks son ms cortas, ms dbiles, con ms tiempo entre una y otra, y menos regulares que las contracciones del parto verdadero. Las contracciones del parto verdadero se intensifican progresivamente y se tornan regulares y ms frecuentes.  Para controlar la molestia que producen las contracciones de Braxton Hicks, puede cambiar de posicin, darse un bao templado y descansar, beber mucha agua o practicar la respiracin profunda. Esta informacin no tiene como fin reemplazar el consejo del mdico. Asegrese de hacerle al mdico cualquier pregunta que tenga. Document Released: 03/11/2017 Document Revised: 11/08/2017 Document Reviewed: 03/11/2017 Elsevier Patient Education  2020 Elsevier Inc.  

## 2019-03-20 NOTE — MAU Note (Signed)
Pt stated she had some blood and brown spotting when wiping today. Also c/o cramping on and of about every 5 min. Good fetal movement.

## 2019-03-24 ENCOUNTER — Other Ambulatory Visit (HOSPITAL_COMMUNITY)
Admission: RE | Admit: 2019-03-24 | Discharge: 2019-03-24 | Disposition: A | Discharge status: Home / Self Care | Payer: Self-pay | Source: Ambulatory Visit | Attending: Family Medicine | Admitting: Family Medicine

## 2019-03-24 ENCOUNTER — Other Ambulatory Visit: Payer: Self-pay

## 2019-03-24 ENCOUNTER — Ambulatory Visit (INDEPENDENT_AMBULATORY_CARE_PROVIDER_SITE_OTHER): Payer: Self-pay | Admitting: Family Medicine

## 2019-03-24 ENCOUNTER — Encounter: Payer: Self-pay | Admitting: Family Medicine

## 2019-03-24 VITALS — BP 108/68 | HR 87 | Wt 187.0 lb

## 2019-03-24 DIAGNOSIS — Z3483 Encounter for supervision of other normal pregnancy, third trimester: Secondary | ICD-10-CM | POA: Insufficient documentation

## 2019-03-24 DIAGNOSIS — Z3A37 37 weeks gestation of pregnancy: Secondary | ICD-10-CM

## 2019-03-24 NOTE — Progress Notes (Signed)
Shaterra Sanzone Vela Prose is a 33 y.o. G1P0000 at 75w6dby third trimester [place] for routine follow up.  She reports feeling baby move, some spotting, mild contractions (about twice per day), some leakage of fluid (thin, brownish yellow).  See flow sheet for details.  MAU visit 8/7 for spotting and cramping, fingertip dilation with reactive NST. Reports it went away, but then noticed a blood clot yesterday.  PE:   Gen: NAD GYN: Normal external exam. Cervix without abnormality, closed and thick without bloody show or leakage of fluid noted. Copious amounts of thin white discharge.  Bedside UKore[place]omplete, vertex presentation.  A/P: Pregnancy at 359w6dDoing well.   Pregnancy issues include: - Rubella nonimmune status: receive MMR postpartum - Spanish speaking, Adopt a mom - Chlamydia in pregnancy - negative TOC 02/25/19.  Infant feeding choice: breast and bottle Contraception choice: thinking about nexplanon Infant circumcision desired: no  GBS and repeat gc/chlamydia testing results were collected today.   Labor and fetal movement precautions reviewed.  Follow up 1 week.   AlRory PercyDO PGY-3, CoGrand Riversamily Medicine 03/24/2019 12:43 PM

## 2019-03-24 NOTE — Patient Instructions (Addendum)
Estuvo muy bien verte!  Nuestros planes para hoy: - Si sus contracciones aumentan y se vuelven ms regulares o si tiene una fuga significativa de lquido acuoso, debe acudir al MAU. - Estamos revisando algunos laboratorios hoy, lo llamaremos o le enviaremos una carta si son anormales. - Contine tomando sus vitaminas prenatales. - Seguimiento en C.H. Robinson Worldwideuna semana.  Cudese y busque atencin inmediata antes si tiene alguna inquietud.  Dr. Mollie Germanyumball [facility]   Tercer trimestre de Vanetta Muldersembarazo Third Trimester of Pregnancy El tercer trimestre comprende desde la semana28 hasta la semana40 (desde el mes7 hasta el mes9). El tercer trimestre es un perodo en el que el beb en gestacin (feto) crece rpidamente. Hacia el final del noveno mes, el feto mide alrededor de 20pulgadas (45cm) de largo y pesa entre 6 y 10 libras (2,700 y 184,500kg). Cambios en el cuerpo durante el tercer trimestre Su organismo continuar atravesando por muchos cambios durante el Wellsembarazo. Estos cambios varan de Owyheeuna mujer a Liechtensteinotra. Durante el tercer trimestre:  Seguir aumentando de Clermontpeso. Es de esperar que aumente entre 25 y 35libras (11 y 16kg) hacia el final del Psychiatristembarazo.  Podrn aparecer las primeras Albertson'sestras en las caderas, el abdomen y las Washington Heightsmamas.  Puede tener necesidad de Geographical information systems officerorinar con ms frecuencia porque el feto baja hacia la pelvis y ejerce presin sobre la vejiga.  Puede desarrollar o continuar teniendo Merchant navy officeracidez estomacal. Esto se debe a que el aumento de las hormonas hace que los msculos en el tubo digestivo trabajen ms lentamente.  Puede desarrollar o continuar teniendo estreimiento debido a que el aumento de las hormonas ralentiza la digestin y hace que los msculos que New York Life Insuranceempujan los desechos a travs de los intestinos se relajen.  Puede desarrollar hemorroides. Estas son venas hinchadas (venas varicosas) en el recto que pueden causar picazn o dolor.  Puede desarrollar venas hinchadas y abultadas (venas  varicosas) en las piernas.  Puede presentar ms dolor en la pelvis, la espalda o los muslos. Esto se debe al Citigroupaumento de peso y al aumento de las hormonas que relajan las articulaciones.  Tal vez haya cambios en el cabello. Esto cambios pueden incluir su engrosamiento, crecimiento rpido y Allied Waste Industriescambios en la textura. Adems, a algunas mujeres se les cae el cabello durante o despus del embarazo, o tienen el cabello seco o fino. Lo ms probable es que el cabello se le normalice despus del nacimiento del beb.  Sus pechos seguirn creciendo y se pondrn cada vez ms sensibles. Un lquido amarillo Charity fundraiser(calostro) puede salir de sus pechos. Esta es la primera leche que usted produce para su beb.  El ombligo puede salir hacia afuera.  Puede observar que se le Eli Lilly and Companyhinchan las manos, el rostro o los tobillos.  Puede presentar un aumento del hormigueo o entumecimiento en las manos, brazos y piernas. La piel de su vientre tambin puede sentirse entumecida.  Puede sentir que le falta el aire debido a que se expande el tero.  Puede tener ms problemas para dormir. Esto puede deberse al tamao de su vientre, una mayor necesidad de Geographical information systems officerorinar y un aumento en el metabolismo de su cuerpo.  Puede notar que el feto "baja" o lo siente ms bajo, en el abdomen (aligeramiento).  Puede tener un aumento de la secrecin vaginal.  Puede notar que las articulaciones se sienten flojas y puede sentir dolor alrededor del hueso plvico. Qu debe esperar en las visitas prenatales Le harn exmenes prenatales cada 2semanas hasta la semana36. A partir de ese Lucent Technologiesmomento le harn los  exmenes semanales. Durante una visita prenatal de rutina:  La pesarn para asegurarse de que usted y el beb estn creciendo normalmente.  Le tomarn la presin arterial.  Le medirn el abdomen para controlar el desarrollo del beb.  Se escucharn los latidos cardacos fetales.  Se evaluarn los resultados de los estudios solicitados en visitas  anteriores.  Le revisarn el cuello del tero cuando est prxima la fecha de parto para controlar si el cuello uterino se ha afinado o adelgazado (borrado).  Le harn una prueba de estreptococos del grupo B. Esto sucede AutoNationentre las semanas 35 y 5237. El mdico puede preguntarle lo siguiente:  Cmo le gustara que fuera el Pauls Valleyparto.  Cmo se siente.  Si siente los movimientos del beb.  Si ha tenido sntomas anormales, como prdida de lquido, Lemingsangrado, dolores de cabeza intensos o clicos abdominales.  Si est consumiendo algn producto que contenga tabaco, como cigarrillos, tabaco de Theatre managermascar y Administrator, Civil Servicecigarrillos electrnicos.  Si tiene Colgate-Palmolivealguna pregunta. Otros exmenes o estudios de deteccin que pueden realizarse durante el tercer trimestre incluyen lo siguiente:  Anlisis de sangre para controlar los niveles de hierro (anemia).  Controles fetales para determinar su salud, nivel de Saint Vincent and the Grenadinesactividad y Designer, jewellerycrecimiento. Si tiene Jerseyalguna enfermedad o hay problemas durante el embarazo, le harn estudios.  Prueba sin estrs. Esta prueba verifica la salud de su beb y se Cocos (Keeling) Islandsutiliza para Engineer, manufacturingdetectar signos de problemas, tales como si el beb no est recibiendo suficiente oxgeno. Durante esta prueba, se coloca un cinturn alrededor de su vientre. Al moverse el beb, se controla su frecuencia cardaca. Qu es el falso Jacksontrabajo de Delawareparto? El falso trabajo de parto es una afeccin en la que se sienten pequeos e irregulares espasmos de los msculos del tero (contracciones) que generalmente desaparecen al hacer reposo, cambiar de posicin o al beber agua. Estas contracciones se llaman contracciones de CSX CorporationBraxton Hicks. Las Fifth Third Bancorpcontracciones pueden durar horas, [address]  [address]das o incluso semanas, antes de que el verdadero trabajo de parto se inicie. Si las contracciones ocurren a intervalos regulares, se vuelven ms frecuentes, aumentan en intensidad o se vuelven dolorosas, debera ver al mdico.  Cules son los signos del Monroevilletrabajo de Delawareparto?  Clicos  abdominales.  Contracciones regulares que comienzan en intervalos de 10 minutos y se vuelven ms fuertes y ms frecuentes con el tiempo.  Contracciones que comienzan en la parte superior del tero y se extienden hacia abajo, a la zona inferior del abdomen y la espalda.  Aumento de la presin en la pelvis y Armed forces logistics/support/administrative officerdolor latente en la espalda.  Una secrecin de mucosidad acuosa o con sangre que sale de la vagina.  Prdida de lquido amnitico. Esto tambin se conoce como "ruptura de la bolsa de las aguas". Esto puede ser un chorro o un goteo constante y lento de lquido. Informe a su mdico si tiene un color u Freeport-McMoRan Copper & Goldolor extrao. Si tiene alguno de Freescale Semiconductorestos signos, llame a su mdico de inmediato, incluso si es antes de la fecha de Pahokeeparto. Siga estas indicaciones en su casa: Medicamentos  Siga las indicaciones del mdico en relacin con el uso de medicamentos. Durante el embarazo, hay medicamentos que pueden tomarse y otros que no.  Tome vitaminas prenatales que contengan por lo menos 600microgramos (?g) de cido flico.  Si est estreida, tome un laxante suave, si el mdico lo autoriza. Qu debe comer y beber   Meriel FlavorsLleve una dieta equilibrada que incluya gran cantidad de frutas y verduras frescas, cereales integrales, buenas fuentes de protenas como carnes Grahammagras, Polandhuevos o  tofu, y lcteos descremados. El mdico la ayudar a Production assistant, radiodeterminar la cantidad de peso que puede Shawneeaumentar.  No coma carne cruda ni quesos sin cocinar. Estos elementos contienen grmenes que pueden causar defectos congnitos en el beb.  Si no consume muchos alimentos con calcio, hable con su mdico sobre si debera tomar un suplemento diario de calcio.  La ingesta diaria de cuatro o cinco comidas pequeas en lugar de tres comidas abundantes.  Limite el consumo de alimentos con alto contenido de grasas y azcares procesados, como alimentos fritos o dulces.  Para evitar el estreimiento: ? Bebe suficiente lquido para mantener la orina  clara o de color amarillo plido. ? Consuma alimentos ricos en fibra, como frutas y verduras frescas, cereales integrales y frijoles. Actividad  Haga ejercicio solamente como se lo haya indicado el mdico. La mayora de las mujeres pueden continuar su rutina de ejercicios durante el Udellembarazo. Intente realizar como mnimo 30minutos de actividad fsica por lo menos 5das a la semana. Deje de hacer ejercicio si experimenta contracciones uterinas.  Evite levantar pesos Fortune Brandsexcesivos.  No haga ejercicio en condiciones de calor o humedad extremas, o a grandes alturas.  Use zapatos cmodos de tacn bajo.  Adopte una buena postura.  Puede seguir teniendo The St. Paul Travelersrelaciones sexuales, excepto que el mdico le diga lo contrario. Alivio del dolor y del Cactusmalestar  Haga pausas frecuentes y descanse con las piernas elevadas si tiene calambres en las piernas o dolor en la zona lumbar.  Dese baos de asiento con agua tibia para Engineer, materialsaliviar el dolor o las molestias causadas por las hemorroides. Use una crema para las hemorroides si el mdico la autoriza.  Use un sostn que le brinde buen soporte para prevenir las molestias causadas por la sensibilidad en los pechos.  Si tiene venas varicosas: ? Use pantimedias que brinden soporte o medias de compresin como se lo haya indicado el mdico. ? Eleve los pies durante 15minutos, 3 o 4veces por da. Cuidados prenatales  Escriba sus preguntas. Llvelas cuando concurra a las visitas prenatales.  Concurra a todas las visitas prenatales tal como se lo haya indicado el mdico. Esto es importante. Seguridad  Use el cinturn de seguridad en todo momento mientras conduce.  Haga una lista de los nmeros de telfono de Associate Professoremergencia, que W. R. Berkleyincluya los nmeros de telfono de familiares, Stonerstownamigos, el hospital y los departamentos de polica y bomberos. Instrucciones generales  Evite el contacto con las bandejas sanitarias de los gatos y la tierra que estos animales usan. Estos  elementos contienen grmenes que pueden causar defectos congnitos en el beb. Si tiene Financial controllerun gato, pdale a alguien que limpie la caja de arena por usted.  No haga viajes largos excepto que sea absolutamente necesario y solo con la autorizacin de su mdico.  No se d baos de inmersin en agua caliente, baos turcos ni saunas.  No beber alcohol.  No consuma ningn producto que contenga nicotina o tabaco, como cigarrillos y Administrator, Civil Servicecigarrillos electrnicos. Si necesita ayuda para dejar de fumar, consulte al mdico.  No use hierbas medicinales ni medicamentos que no le hayan recetado. Estas sustancias qumicas afectan la formacin y el desarrollo del beb.  No se haga duchas vaginales ni use tampones o toallas higinicas perfumadas.  No mantenga las piernas cruzadas durante largos periodos de Camanche Villagetiempo.  Para prepararse para la llegada de su beb: ? Tome clases prenatales para entender, Education administratorpracticar, y hacer preguntas sobre el Monticellotrabajo de parto y Mullikenel parto. ? Haga un ensayo de la partida al hospital. ?  Visite el hospital y recorra el rea de maternidad. ? Pida un permiso de maternidad o paternidad a sus empleadores. ? Organice para que Regulatory affairs officer o amigo cuide a sus mascotas mientras usted est en el hospital. ? Compre un asiento de seguridad QUALCOMM, y asegrese de saber cmo instalarlo en su automvil. ? Prepare el bolso que llevar al hospital. ? Prepare la habitacin del beb. Asegrese de quitar todas las almohadas y Cochran de peluche de la cuna del beb para evitar la asfixia.  Visite a su dentista si no lo ha Quarry manager. Use un cepillo de dientes blando para higienizarse los dientes y psese el hilo dental con suavidad. Comunquese con un mdico si:  No est segura de que est en trabajo de parto o de que ha roto la bolsa de las aguas.  Se siente mareada.  Siente clicos leves, presin en la pelvis o dolor persistente en el abdomen.  Siente dolor en la parte  inferior de la espalda.  Tiene nuseas, vmitos o diarrea persistentes.  Margette Fast secrecin vaginal inusual o con mal olor.  Siente dolor al Continental Airlines. Solicite ayuda de inmediato si:  Rompe la bolsa de las aguas antes de la semana 37.  Tiene contracciones regulares en intervalos de menos de 5 minutos antes de la semana 86.  Tiene fiebre.  Tiene una prdida de lquido por la vagina.  Tiene sangrado o pequeas prdidas vaginales.  Tiene dolor o clicos abdominales intensos.  Baja de peso o sube de peso rpidamente.  Tiene dificultad para respirar y siente dolor de pecho.  Sbitamente se le hinchan mucho el rostro, las [place], los tobillos, los pies o las piernas.  Su beb se mueve menos de 10 veces en 2 horas.  Siente un dolor de cabeza intenso que no se alivia al tomar Dynegy.  Nota cambios en la visin. Resumen  El tercer trimestre comprende desde la [id number] UnitedHealth [id number], es decir, desde el mes7 hasta el mes9. El tercer trimestre es un perodo en el que el beb en gestacin (feto) crece rpidamente.  Durante el tercer trimestre, su incomodidad puede aumentar a medida que usted y su beb continan aumentando de [place]. Es posible que tenga dolor abdominal, en las piernas y en la [place], [place] para dormir y [place] mayor necesidad de Garment/textile technologist.  Durante el tercer trimestre, sus pechos seguirn creciendo y se pondrn cada vez ms sensibles. Un lquido amarillo Public affairs consultant) puede salir de sus pechos. Esta es la primera leche que usted produce para su beb.  El falso trabajo de parto es una afeccin en la que se sienten pequeos e irregulares espasmos de los msculos del tero (contracciones) que a Programme researcher, broadcasting/film/video. Estas contracciones se llaman contracciones de SLM Corporation. Las Yahoo pueden durar horas, das o incluso semanas, antes de que el verdadero trabajo de parto se inicie.  Los signos del trabajo de parto pueden incluir: calambres abdominales;  contracciones regulares que comienzan en intervalos de 10 minutos y se vuelven ms fuertes y ms frecuentes con el tiempo; una secrecin de mucosidad acuosa o con sangre que sale de la vagina; aumento de la presin en la pelvis y Social research officer, government latente en la espalda; y prdida de lquido amnitico. Esta informacin no tiene Marine scientist el consejo del mdico. Asegrese de hacerle al mdico cualquier pregunta que tenga. Document Released: 05/09/2005 Document Revised: 12/11/2016 Document Reviewed: 12/11/2016 Elsevier Patient Education  2020 [employer].

## 2019-03-25 LAB — CERVICOVAGINAL ANCILLARY ONLY
Chlamydia: NEGATIVE
Neisseria Gonorrhea: NEGATIVE

## 2019-03-28 LAB — CULTURE, BETA STREP (GROUP B ONLY): Strep Gp B Culture: NEGATIVE

## 2019-03-31 ENCOUNTER — Other Ambulatory Visit: Payer: Self-pay

## 2019-03-31 ENCOUNTER — Ambulatory Visit (INDEPENDENT_AMBULATORY_CARE_PROVIDER_SITE_OTHER): Payer: Self-pay | Admitting: Family Medicine

## 2019-03-31 VITALS — BP 104/68 | HR 82 | Wt 187.0 lb

## 2019-03-31 DIAGNOSIS — Z3483 Encounter for supervision of other normal pregnancy, third trimester: Secondary | ICD-10-CM

## 2019-03-31 NOTE — Progress Notes (Signed)
O2: 98

## 2019-03-31 NOTE — Patient Instructions (Addendum)
  Estuvo muy bien verte!  Nuestros planes para hoy: - Considere el DIU Mirena, ya que se puede colocar inmediatamente despus del parto y probablemente sea su opcin ms barata. Tambin puede considerar el Nexplanon, pero probablemente sea ms caro para usted. Estos son buenos por 5 y 3 aos, respectivamente. - Vuelve la semana que viene para un chequeo. - Si tiene contracciones ms regulares, prdida de lquido, vaya al MAU / ED en [place].  Tenga cuidado y busque atencin inmediata antes si tiene alguna inquietud.  Dr. [facility]

## 2019-03-31 NOTE — Progress Notes (Signed)
Amy Welch is a 33 y.o. G1P0000 at [redacted]w[redacted]d here for routine follow up.  She reports feeling baby move, no LOF, scant bleeding, one day last week of cramping.  See flow sheet for details.  A/P: Pregnancy at [redacted]w[redacted]d. Doing well.   Pregnancy issues include  - rubella non immune - Adopt a Mom, spanish speaking.  Infant feeding choice: breast/bottle Contraception choice: undecided, considering nexplanon, has had previously. Infant circumcision desired: no  GBS and gc/chlamydia testing results were reviewed today, negative. Rubella non-immune - will need vaccination postpartum. Contraception - encouraged patient to consider nexplanon vs IUD given lack of insurance and desire to breastfeed. Labor and fetal movement precautions reviewed. Follow up 1 week.

## 2019-04-10 ENCOUNTER — Encounter (HOSPITAL_COMMUNITY): Payer: Self-pay | Admitting: *Deleted

## 2019-04-10 ENCOUNTER — Other Ambulatory Visit: Payer: Self-pay | Admitting: Advanced Practice Midwife

## 2019-04-10 ENCOUNTER — Other Ambulatory Visit: Payer: Self-pay

## 2019-04-10 ENCOUNTER — Telehealth (HOSPITAL_COMMUNITY): Payer: Self-pay | Admitting: *Deleted

## 2019-04-10 ENCOUNTER — Ambulatory Visit (INDEPENDENT_AMBULATORY_CARE_PROVIDER_SITE_OTHER): Payer: Self-pay | Admitting: Family Medicine

## 2019-04-10 VITALS — BP 102/64 | HR 77 | Wt 190.1 lb

## 2019-04-10 DIAGNOSIS — Z3483 Encounter for supervision of other normal pregnancy, third trimester: Secondary | ICD-10-CM

## 2019-04-10 NOTE — Progress Notes (Addendum)
Amy Welch is a 33 y.o. G1P0000 at [redacted]w[redacted]d here for routine follow up.  She reports feeling baby move, irregular contractions, no vaginal bleeding or abnormal discharge. See flow sheet for details.  A/P: Pregnancy at [redacted]w[redacted]d. Doing well.   Pregnancy issues include  - rubella non immune - Adopt a Mom, spanish speaking.  Infant feeding choice: breast/bottle Contraception choice: nexplanon Infant circumcision desired: no  GBS and gc/chlamydia testing results were reviewed today.    Twice weekly testing scheduled. Induction scheduled for 41+ weeks on 03/17/19 at 7am. NST scheduled for 8/31 at 2:15pm. She will get the appointment for the second NST at this appointment.  Labor and fetal movement precautions reviewed.  Rory Percy, DO PGY-3, [facility] 04/10/2019 11:57 AM

## 2019-04-10 NOTE — Telephone Encounter (Signed)
Preadmission screen Interpreter number [phone]

## 2019-04-10 NOTE — Patient Instructions (Addendum)
It was great to see you!  Our plans for today:  - Your induction is scheduled for Friday, August 4 at 7:00am - If you begin to have regular contractions about 4 minutes apart, have leakage of fluid or if you stop feeling baby move, you should go to the Maternity Admissions Unit at [facility] (next to the Emergency Department)  Take care and seek immediate care sooner if you develop any concerns.   Dr. [facility]   Induccin del Mat Carne de parto Labor Induction  Se denomina induccin del trabajo de parto cuando se inician acciones para hacer que una mujer embarazada comience el [place] de [place]. La State Farm de las mujeres comienzan el trabajo de parto de forma natural entre las semanas 61 y 30 del Media planner. Cuando esto no ocurre o cuando por necesidad mdica se debe iniciarlo por otros medios, se podran Event organiser. La induccin del trabajo de parto hace que el tero se contraiga. Tambin hace que el cuello uterino se ablande (madure), se abra (se dilate), y se afine (se borre). Generalmente, el trabajo de parto no se induce antes de las 39semanas, excepto que haya un motivo mdico para Nature conservation officer. El mdico determinar si se debe inducir el [place]. Antes de inducir el trabajo de Vivian, el mdico considerar ciertos factores; entre ellos:  Su cuadro clnico y el del beb.  En cul semana del embarazo se encuentra.  La madurez de los pulmones del beb.  El estado del cuello uterino.  La posicin del beb.  El tamao del canal del parto. Cules son algunos de los motivos para inducir un parto? Se podra inducir un parto en los siguientes casos:  Su salud o la salud del beb estn en riesgo.  El embarazo se pas de trmino 1semana o ms.  Rompi la bolsa, pero no se inici el trabajo de parto de forma natural.  La cantidad de lquido amnitico que rodea al beb es poca. Tambin podra optar por (elegir) que le induzcan el trabajo de parto en un  determinado momento. Por lo general, la induccin del trabajo de parto por eleccin no se hace antes de las 39semanas del [place]. Qu mtodos se usan para inducir el [place] de [place]? Los mtodos utilizados para inducir el trabajo de parto incluyen los siguientes:  Administracin de prostaglandina. Este medicamento hace que se inicien las contracciones y que el cuello uterino se dilate y Cecilia. Puede tomarse por boca (de forma oral) o introducirse en la vagina (supositorio).  Insercin de un pequeo tubo delgado (catter) que tiene un baln en el extremo en la vagina; luego, expansin del baln con agua para dilatar el cuello uterino.  Ruptura de las [place]. En este mtodo, el mdico separa, con cuidado, el tejido del saco amnitico del cuello uterino. En consecuencia, el cuello uterino se expande, lo que, a la vez, provoca la liberacin de una hormona llamada progesterona. Esta hormona hace que el tero se contraiga. Este procedimiento suele Associate Professor del mdico; luego, la enviarn a su hogar para esperar a que Nature conservation officer.  Romper la bolsa de las aguas. En este mtodo, el mdico [place] un pequeo instrumento para hacer un pequeo orificio en el saco amnitico. Al cabo de un tiempo, esto har que el saco amnitico se rompa. Las contracciones deberan comenzar algunas horas despus.  Medicamentos que desencadenen o [place] contracciones. Estos se administran a travs de una va intravenosa (IV) que se coloca en una vena del  brazo. Excepto la ruptura de las North Omakmembranas, que puede realizarse en una Augustclnica, la induccin del Waskomtrabajo de parto se realiza en el hospital para que puedan controlarlos con atencin a usted y al beb. Cunto tiempo lleva inducir el Woodsidetrabajo de parto? La duracin del proceso de induccin depende de la preparacin del cuerpo para el trabajo de Friendsvilleparto. Algunas inducciones pueden durar hasta 2 o 3das, mientras que otras podran durar  menos de Civil engineer, contractingun da. La induccin podra durar ms en los siguientes casos:  La induccin se hace en una etapa temprana del embarazo.  Es Engineer, agriculturalel primer embarazo de la Chambleemadre.  El cuello uterino no est listo. Cules son algunos de los riesgos asociados con la induccin del Woodvilletrabajo de Delawareparto? Algunos de los riesgos asociados con la induccin del Senecatrabajo de parto son los siguientes:  Cambios en la frecuencia cardaca fetal, por ejemplo, los latidos son demasiado rpidos o lentos, o son irregulares (errticos).  Fracaso de la induccin.  Infeccin en la madre o el beb.  Aumento de la posibilidad de que sea necesaria una cesrea.  Muerte fetal.  Ruptura (desprendimiento) de la placenta del tero (raro).  Ruptura del tero (muy poco frecuente). Cuando es Passenger transport managernecesario realizar una induccin por motivos mdicos, los beneficios suelen Apache Corporationsuperar los riesgos. Cules son algunos de los motivos para no inducir el Mifflintowntrabajo de Clay Centerparto? La induccin no debe realizarse si:  El beb no tolera las contracciones.  Se someti anteriormente a cirugas en el tero, como una miomectoma, le extirparon fibromas o tiene una cicatriz vertical de un parto por cesrea anterior.  La placenta est en una posicin muy baja en el tero y obstruye la abertura del cuello uterino (placenta previa).  El beb no est ubicado con la Walgreencabeza hacia bajo.  El cordn umbilical cae hacia el canal del parto, adelante del beb.  Hay circunstancias poco habituales, como que se encuentre en una etapa muy temprana del embarazo (beb prematuro).  Tuvo ms de 2partos por cesrea anteriormente. Resumen  Se denomina induccin del trabajo de parto cuando se inician acciones para hacer que una mujer embarazada comience el [place] Estatestrabajo de Emeradoparto.  La induccin del trabajo de parto hace que el tero se contraiga. Tambin hace que el cuello uterino se Kentfieldaliste, se dilate y se borre.  El Markhamtrabajo de parto no se induce antes de las 39semanas de  Muskegogestacin, excepto que haya un motivo mdico para Media plannerhacerlo.  Cuando es Passenger transport managernecesario realizar una induccin por motivos mdicos, los beneficios suelen Apache Corporationsuperar los riesgos. Esta informacin no tiene Theme park managercomo fin reemplazar el consejo del mdico. Asegrese de hacerle al mdico cualquier pregunta que tenga. Document Released: 11/06/2007 Document Revised: 04/09/2017 Document Reviewed: 04/09/2017 Elsevier Patient Education  2020 ArvinMeritorElsevier Inc.

## 2019-04-10 NOTE — Addendum Note (Signed)
Addended by: Myles Gip on: 04/10/2019 11:57 AM   Modules accepted: Orders

## 2019-04-13 ENCOUNTER — Ambulatory Visit (INDEPENDENT_AMBULATORY_CARE_PROVIDER_SITE_OTHER): Payer: Self-pay | Admitting: *Deleted

## 2019-04-13 ENCOUNTER — Other Ambulatory Visit: Payer: Self-pay

## 2019-04-13 ENCOUNTER — Ambulatory Visit: Payer: Self-pay

## 2019-04-13 ENCOUNTER — Other Ambulatory Visit: Payer: Self-pay | Admitting: Family Medicine

## 2019-04-13 VITALS — BP 124/71 | HR 73 | Wt 190.1 lb

## 2019-04-13 DIAGNOSIS — O480 Post-term pregnancy: Secondary | ICD-10-CM

## 2019-04-13 DIAGNOSIS — Z3490 Encounter for supervision of normal pregnancy, unspecified, unspecified trimester: Secondary | ICD-10-CM

## 2019-04-13 NOTE — Addendum Note (Signed)
Addended by: Myles Gip on: 04/13/2019 05:03 PM   Modules accepted: Orders

## 2019-04-15 ENCOUNTER — Other Ambulatory Visit (HOSPITAL_COMMUNITY)
Admission: RE | Admit: 2019-04-15 | Discharge: 2019-04-15 | Disposition: A | Discharge status: Home / Self Care | Payer: Self-pay | Source: Ambulatory Visit | Attending: Family Medicine | Admitting: Family Medicine

## 2019-04-15 ENCOUNTER — Ambulatory Visit (INDEPENDENT_AMBULATORY_CARE_PROVIDER_SITE_OTHER): Payer: Self-pay | Admitting: Family Medicine

## 2019-04-15 ENCOUNTER — Other Ambulatory Visit: Payer: Self-pay

## 2019-04-15 ENCOUNTER — Encounter: Payer: Self-pay | Admitting: Family Medicine

## 2019-04-15 VITALS — BP 102/70 | HR 76 | Wt 191.6 lb

## 2019-04-15 DIAGNOSIS — Z3483 Encounter for supervision of other normal pregnancy, third trimester: Secondary | ICD-10-CM

## 2019-04-15 DIAGNOSIS — Z20828 Contact with and (suspected) exposure to other viral communicable diseases: Secondary | ICD-10-CM | POA: Diagnosis present

## 2019-04-15 LAB — SARS CORONAVIRUS 2 BY RT PCR (HOSPITAL ORDER, PERFORMED IN ~~LOC~~ HOSPITAL LAB): SARS Coronavirus 2: NEGATIVE

## 2019-04-15 NOTE — Progress Notes (Signed)
Amy Welch is a 33 y.o. G1P0000 at [redacted]w[redacted]d dated by growth [place] at 30 weeks here for routine follow up.  She reports contractions about every ten minutes that are not painful.  See flow sheet for details.  Exam:  Cervical Exam: thick, soft, outer cervix 1 cm.  Baby is vertex, confirmed by POCUS in office today.   A/P: Pregnancy at [redacted]w[redacted]d. Doing well. Most recent BPP 10/10.  Pregnancy issues include:  - rubella non immune - Adopt a Mom, spanish speaking- interpretor present at visit today  - GC, resolved TOC  - uterine size discrepancy in third trimester. Dating [place] @ 30 weeks.  - Twice weekly NST  - Please call me when patient arrives to L & D for continuity delivery   Infant feeding choice: breast/bottle Contraception choice: nexplanon Infant circumcision desired: no  GBS negative(03/24/19). COVID negative (04/15/19)  Twice weekly testing scheduled. Induction scheduled for 41+ weeks on 04/17/19 at 7am.  Labor and fetal movement precautions reviewed.  Wilber Oliphant, M.D.  PGY-2  Family Medicine  [phone] 04/15/2019 3:09 PM

## 2019-04-15 NOTE — MAU Note (Signed)
Covid swab collected. PT tolerated well. PT asymptomatic 

## 2019-04-15 NOTE — Patient Instructions (Addendum)
Dear Amy Welch,   It was good to see you! Thank you for taking your time to come in to be seen. Today, we discussed the following:   Baby is almost here!!    [facility] when you are having frequent contractions that are five minutes apart and at least 1 minute each for one hour.   Induction time: September 4 at 7 AM   We will see you soon!      Be well,   Genia Hotterachel Aaleah Hirsch, M.D   [facility] [phone]  *Sign up for MyChart for instant access to your health profile, labs, orders, upcoming appointments or to contact your provider with questions*  ===================================================================================  any questions or concerns regarding your health or any of the items above.   Signos y sntomas del trabajo de parto Signs and Symptoms of Labor El McGregortrabajo de parto es el proceso natural del cuerpo por el cual se saca al beb, la placenta y el cordn umbilical del tero. Por lo general, el proceso del Wilsontrabajo de parto comienza cuando el embarazo ha llegado a su trmino, entre 37 y 40 semanas de Psychiatristembarazo. Cmo sabr que estoy prxima a comenzar el trabajo de parto? A medida que el cuerpo se prepara para el trabajo de parto y el nacimiento del beb, puede notar los siguientes sntomas en las semanas y Queen Valleydas anteriores al trabajo de parto propiamente dicho:  Un fuerte deseo de preparar su casa para recibir al nuevo beb. Esto se denomina anidacin. La anidacin puede ser un signo de que se est acercando el Glen Ferristrabajo de Haleburgparto, y puede ocurrir varias semanas antes del nacimiento. La anidacin puede implicar limpiar y Chiropodistorganizar la casa.  Una pequea cantidad de mucosidad espesa y con Pensions consultantsangre sale de la vagina (aparicin normal de sangre o prdida del tapn mucoso). Esto puede suceder ms de una semana antes de que comience el Rockporttrabajo de Cumbolaparto, o puede ocurrir justo antes de que comience el Laconatrabajo de parto a medida que  el cuello uterino comienza a ensancharse (dilatarse). En algunas mujeres, el tapn Rockwell Automationmucoso sale entero de una sola vez. En otras, pueden salir pequeas partes del tapn mucoso de forma gradual durante varios das.  El beb se mueve (desciende) a la parte inferior de la pelvis para ponerse en posicin para el nacimiento (aligeramiento). Cuando esto sucede, puede sentir ms presin en la vejiga y el hueso plvico, y menos presin en las costillas. Esto facilitar la respiracin. Tambin puede hacer que necesite orinar con ms frecuencia y que tenga problemas para hacer de vientre.  Tener "contracciones de Multimedia programmerprctica" (contracciones de CSX CorporationBraxton Hicks) que ocurren a Psychologist, occupationalintervalos irregulares (espaciadas de modo desigual) con una diferencia de ms de 10 minutos. Esto tambin se denomina trabajo de [id number] Lah[place]parto falso. Las contracciones de San Josetrabajo de parto falso son comunes luego del ejercicio o la actividad sexual, y se detendrn si cambia de posicin, descansa o toma lquidos. Estas contracciones son generalmente leves y no se tornan ms fuertes con el tiempo. Pueden sentirse como lo siguiente: ? Un dolor de espalda. ? Calambres leves, similares a los Sonic Automotivedolores menstruales. ? Tirantez o presin en el abdomen. Otros sntomas tempranos de que el trabajo de parto puede comenzar pronto incluyen:  Nuseas o prdida del apetito.  Diarrea.  Un repentino estallido de energa o sentirse muy cansada.  Cambios en el humor.  Problemas para dormir. Cmo sabr cuando ha comenzado el trabajo de parto? Los signos de que  ha comenzado el trabajo de parto verdadero pueden incluir:  Contracciones a intervalos regulares (espaciadas de modo regular) que se incrementan en intensidad. Esto puede sentirse como presin o estrechamiento intenso en el abdomen, que se desplaza hacia la espalda. ? Las contracciones pueden sentirse tambin como dolor rtmico en la parte superior de los muslos y la espalda que va y viene a intervalos regulares.  ? Para las M.D.C. Holdingsmadres primerizas, este cambio en intensidad de las contracciones ocurre generalmente a un ritmo ms gradual. ? Las mujeres que ya han sido madres pueden notar una progresin ms rpida de los cambios de las contracciones.  Una sensacin de presin en el rea vaginal.  Ruptura de la bolsa (ruptura de las Dundeemembranas). Es cuando el saco de lquido que rodea al beb se rompe. Cuando esto sucede, notar que Liberty Mediale sale lquido de la vagina. Este puede ser claro o estar manchado de Spencerportsangre. Generalmente el trabajo de parto comienza 24 horas despus de la ruptura de Humphreybolsa, [place] Virginiapero puede tomar ms Psychologist, clinicaltiempo en comenzar. ? Algunas mujeres notan esto como un chorro de lquido. ? Otras notan que la ropa interior se moja repetidas veces. Siga estas indicaciones en su casa:   Cuando comience el trabajo de parto o si rompe bolsa, llame al mdico o a la lnea de atencin de enfermera. Ellos, en funcin de su situacin, determinarn cundo debe ir a Location managerhacerse un examen.  Si entra en trabajo de parto temprano, es posible que pueda descansar y Apple Computermanejar los sntomas en su casa. Algunas estrategias para probar en su casa incluyen: ? Tcnicas de respiracin y relajacin. ? Tomar una ducha o un bao de inmersin tibios. ? Optometristscuchar msica. ? Usar una almohadilla trmica en la espalda para Engineer, materialsaliviar el dolor. Si se le indica que use calor:  Coloque una toalla entre la piel y la fuente de Airline pilotcalor.  Aplique el calor durante 20 a 30minutos.  Retire la fuente de calor si la piel se pone de color rojo brillante. Esto es muy importante si no puede Financial risk analystsentir dolor, calor o fro. Puede correr un riesgo mayor de sufrir quemaduras. Solicite ayuda de inmediato si:  Tiene contracciones dolorosas y regulares cada 5 minutos o menos.  El trabajo de parto comienza antes de que se cumplan las 37 semanas de West Loganembarazo.  Tiene fiebre.  Siente un dolor de cabeza intenso que no se Victoriaalivia.  Elimina cogulos de sangre de color rojo  brillante por la vagina.  No siente que el beb se mueva.  Experimenta la aparicin repentina de: ? Dolor de cabeza intenso con problemas de la visin. ? Nuseas, vmitos o diarrea. ? Dolor en el pecho o falta de aire. Estos sntomas pueden Customer service managerindicar una emergencia. Si el mdico recomienda que vaya al hospital o al centro de nacimientos donde va a dar a luz, no conduzca usted misma. Pdale a otra persona que conduzca o llame a los servicios de Associate Professoremergencia (911 en los [place]) Resumen  El trabajo de parto es el proceso natural del cuerpo por el cual se saca al beb, la placenta y el cordn umbilical del tero.  Por lo general, el proceso del Baxtertrabajo de parto comienza cuando el embarazo ha llegado a su trmino, entre 37 y 40 semanas de Psychiatristembarazo.  Cuando comience el trabajo de parto o si rompe bolsa, llame al mdico o a la lnea de atencin de enfermera. Ellos, en funcin de su situacin, determinarn cundo debe ir a Location managerhacerse un examen. Esta informacin no tiene Lehman Brotherscomo  fin reemplazar el consejo del mdico. Asegrese de hacerle al mdico cualquier pregunta que tenga. Document Released: 08/14/2015 Document Revised: 04/29/2017 Document Reviewed: 04/24/2017 Elsevier Patient Education  [place] vaginal Vaginal Delivery  Parto vaginal significa que usted da a luz empujando al beb fuera del canal del parto (vagina). Un equipo de proveedores de atencin mdica la ayudar antes, durante y despus del parto vaginal. Las experiencias de los nacimientos son nicas para todas las [place], y Engineer, technical sales y las experiencias de nacimiento varan segn dnde elija dar a luz. Sander Nephew ocurrir cuando llegue al centro de [place] o al hospital? [place] que se inicie el [place] de parto y haya sido admitida en el hospital o centro de parto, el mdico podr hacer lo siguiente:  Revisar sus antecedentes de Media planner y cualquier inquietud que usted pueda tener.  Colocarle una va intravenosa en una  de las venas. Esto se podr usar para administrarle lquidos y medicamentos.  Verificar su presin arterial, pulso, temperatura y frecuencia cardaca (signos vitales).  Verificar si la bolsa de agua (saco amnitico) se ha roto (ruptura).  Hablar con usted sobre su plan de nacimiento y Physiological scientist las opciones para Financial controller. Monitoreo Su mdico puede monitorear las contracciones (monitoreo uterino) y la frecuencia cardaca del beb (monitoreo fetal). Es posible que el monitoreo se necesite realizar:  Con frecuencia, pero no continuamente (intermitentemente).  Todo el tiempo o durante largos perodos a la vez (continuamente). El monitoreo continuo puede ser necesario si: ? Est recibiendo determinados medicamentos, tales como medicamentos para Best boy o para hacer que las contracciones sean ms fuertes. ? Tiene complicaciones durante el embarazo o el [place] de [place]. El monitoreo se puede realizar:  Al colocar un estetoscopio especial o un dispositivo manual de monitoreo en el abdomen o verificar los latidos cardacos del beb y comprobar las contracciones.  Al colocar monitores en el abdomen (monitores externos) para Museum/gallery curator los latidos cardacos del beb y la frecuencia y [place] contracciones.  Al colocar monitores dentro del tero a travs de la vagina (monitores internos) para Museum/gallery curator los latidos cardacos del beb y la frecuencia, duracin y fuerza de sus contracciones. Segn el tipo de monitor, International aid/development worker en el tero o en la cabeza del beb hasta el nacimiento.  Telemetra. Se trata de un tipo de monitoreo continuo que se puede Optometrist con monitores externos o internos. En lugar de Passenger transport manager en la cama, usted puede moverse durante Control and instrumentation engineer. Examen fsico Su mdico puede realizar exmenes fsicos frecuentes. Esto puede incluir lo siguiente:  Musician cmo y dnde el beb est ubicado en el tero.  Verificar el cuello uterino para  determinar: ? Si se est afinando o estirando (borrando). ? Si se est abriendo (dilatando). [place] parto y [place]?  El [place] de parto y el parto normales se dividen en tres etapas: Etapa 1  Esta es la etapa ms larga del trabajo de [place].  Esta etapa puede [place].  Durante esta etapa, sentir contracciones. En general, las contracciones son leves, infrecuentes e irregulares al principio. Se hacen ms fuertes, ms frecuentes (aproximadamente cada 2 o 3 minutos) y ms regulares a medida que avanza en esta etapa.  Esta etapa finaliza cuando el cuello uterino est completamente dilatado hasta 4 pulgadas (10cm) y completamente borrado. Etapa 2  Esta etapa comienza una vez que el cuello uterino est totalmente borrado y dilatado, y dura Museum/gallery curator  nacimiento del beb.  Esta etapa puede durar de 20 minutos a 2 horas.  Esta es la etapa en la que va a sentir ganas de pujar al beb fuera de la vagina.  Puede sentir un dolor urente y por estiramiento, especialmente cuando la parte ms ancha de la cabeza del beb pasa a travs de la abertura vaginal (coronacin).  Una vez que el beb nace, el cordn umbilical se pinzar y se cortar. Esto ocurre por lo general despus de un perodo de 1 a 2 minutos despus del parto.  Colocarn al beb sobre su pecho desnudo (contacto piel con piel) en una posicin erguida y [place] con [place] abrigada. Observe al beb para detectar seales de hambre, como el reflejo de bsqueda o succin, y acrquelo al pecho para su primera alimentacin. Etapa 3  Esta etapa comienza inmediatamente despus del nacimiento del beb y finaliza despus de la expulsin de la placenta.  Esta etapa puede durar de 5 a 30 minutos.  Despus del nacimiento del beb, puede sentir contracciones cuando el cuerpo expulsa la placenta y el tero se contrae para Radio broadcast assistant. Qu puedo esperar despus del Aleen Campi de parto y [place]?   Una vez que termine el trabajo de [place], se los controlar a usted y al beb atentamente para Warehouse manager la seguridad de que ambos estn sanos y listos para ir a Higher education careers adviser. Su equipo de atencin Art gallery manager cmo cuidarse y cuidar a su beb.  Usted y el beb permanecern en la misma habitacin (cohabitacin) durante su estada en el hospital. Esto estimular una vinculacin temprana y [place].  Puede seguir recibiendo lquidos o medicamentos por va intravenosa.  Se le controlar y Engineer, maintenance (IT) el tero con regularidad (masaje fndico).  Tendr algo de inflamacin y dolor en el abdomen, la vagina y la zona de la piel entre la abertura vaginal y el ano (perineo).  Si se le realiz una incisin cerca de la vagina (episiotoma) o si ha tenido Airline pilot parto, podran indicarle que se coloque compresas fras sobre la episiotoma o Art therapist. Esto ayuda a Engineer, materials y la hinchazn.  Es posible que le den una botella rociadora para que use cuando vaya al bao para higienizarse. Siga los pasos a continuacin para usar la botella rociadora: ? Antes de orinar, llene la botella rociadora con agua tibia. No use agua caliente. ? Despus de Geographical information systems officer, [place] an est sentada en el inodoro, use la botella rociadora para enjuagar el rea alrededor de la uretra y la abertura vaginal. Con esto podr limpiar cualquier rastro de orina y [place]. ? Llene la botella rociadora con agua limpia cada vez que vaya al bao.  Es normal tener hemorragia vaginal despus del [place]. Use un apsito sanitario para el sangrado vaginal y secrecin. Resumen  Parto vaginal significa que usted dar a luz empujando al beb fuera del canal del parto (vagina).  Su mdico puede monitorear las contracciones (monitoreo uterino) y la frecuencia cardaca del beb (monitoreo fetal).  Su mdico puede realizarle un examen fsico.  El trabajo de parto y el parto normales se dividen en tres etapas.  Una vez  que termina el [place] de [place], se los controlar a usted y al beb atentamente hasta que estn listos para ir a casa. Esta informacin no tiene Theme park manager el consejo del mdico. Asegrese de hacerle al mdico cualquier pregunta que tenga. Document Released: 07/12/2008 Document Revised: 10/09/2017 Document Reviewed: 10/09/2017 Elsevier Patient Education  2020 Elsevier Inc.  

## 2019-04-17 ENCOUNTER — Inpatient Hospital Stay (HOSPITAL_COMMUNITY): Payer: Medicaid Other

## 2019-04-17 ENCOUNTER — Encounter (HOSPITAL_COMMUNITY): Payer: Self-pay

## 2019-04-17 ENCOUNTER — Other Ambulatory Visit: Payer: Self-pay

## 2019-04-17 ENCOUNTER — Inpatient Hospital Stay (HOSPITAL_COMMUNITY)
Admission: AD | Admit: 2019-04-17 | Discharge: 2019-04-22 | DRG: 787 | Disposition: A | Discharge status: Home / Self Care | Payer: Medicaid Other | Attending: Obstetrics and Gynecology | Admitting: Obstetrics and Gynecology

## 2019-04-17 DIAGNOSIS — Z87891 Personal history of nicotine dependence: Secondary | ICD-10-CM

## 2019-04-17 DIAGNOSIS — Z789 Other specified health status: Secondary | ICD-10-CM | POA: Diagnosis present

## 2019-04-17 DIAGNOSIS — Z3A41 41 weeks gestation of pregnancy: Secondary | ICD-10-CM

## 2019-04-17 DIAGNOSIS — O324XX0 Maternal care for high head at term, not applicable or unspecified: Secondary | ICD-10-CM | POA: Diagnosis present

## 2019-04-17 DIAGNOSIS — O480 Post-term pregnancy: Secondary | ICD-10-CM | POA: Diagnosis present

## 2019-04-17 DIAGNOSIS — Z283 Underimmunization status: Secondary | ICD-10-CM

## 2019-04-17 DIAGNOSIS — N179 Acute kidney failure, unspecified: Secondary | ICD-10-CM | POA: Diagnosis not present

## 2019-04-17 DIAGNOSIS — O9081 Anemia of the puerperium: Secondary | ICD-10-CM | POA: Diagnosis not present

## 2019-04-17 DIAGNOSIS — Z3483 Encounter for supervision of other normal pregnancy, third trimester: Secondary | ICD-10-CM

## 2019-04-17 DIAGNOSIS — O26833 Pregnancy related renal disease, third trimester: Secondary | ICD-10-CM | POA: Diagnosis present

## 2019-04-17 DIAGNOSIS — Z758 Other problems related to medical facilities and other health care: Secondary | ICD-10-CM | POA: Diagnosis present

## 2019-04-17 DIAGNOSIS — Z2839 Other underimmunization status: Secondary | ICD-10-CM

## 2019-04-17 DIAGNOSIS — Z23 Encounter for immunization: Secondary | ICD-10-CM

## 2019-04-17 DIAGNOSIS — R0602 Shortness of breath: Secondary | ICD-10-CM

## 2019-04-17 DIAGNOSIS — O09899 Supervision of other high risk pregnancies, unspecified trimester: Secondary | ICD-10-CM

## 2019-04-17 DIAGNOSIS — R34 Anuria and oliguria: Secondary | ICD-10-CM | POA: Diagnosis not present

## 2019-04-17 DIAGNOSIS — D649 Anemia, unspecified: Secondary | ICD-10-CM | POA: Diagnosis not present

## 2019-04-17 DIAGNOSIS — Z3490 Encounter for supervision of normal pregnancy, unspecified, unspecified trimester: Secondary | ICD-10-CM

## 2019-04-17 LAB — CBC
HCT: 38.5 % (ref 36.0–46.0)
Hemoglobin: 13 g/dL (ref 12.0–15.0)
MCH: 30.7 pg (ref 26.0–34.0)
MCHC: 33.8 g/dL (ref 30.0–36.0)
MCV: 90.8 fL (ref 80.0–100.0)
Platelets: 182 10*3/uL (ref 150–400)
RBC: 4.24 MIL/uL (ref 3.87–5.11)
RDW: 13.5 % (ref 11.5–15.5)
WBC: 8.1 10*3/uL (ref 4.0–10.5)
nRBC: 0 % (ref 0.0–0.2)

## 2019-04-17 LAB — TYPE AND SCREEN
ABO/RH(D): O POS
Antibody Screen: NEGATIVE

## 2019-04-17 LAB — RPR: RPR Ser Ql: NONREACTIVE

## 2019-04-17 LAB — ABO/RH: ABO/RH(D): O POS

## 2019-04-17 MED ORDER — MISOPROSTOL 50MCG HALF TABLET
50.0000 ug | ORAL_TABLET | ORAL | Status: DC
  Administered 2019-04-17 (×2): 50 ug via ORAL
  Filled 2019-04-17 (×2): qty 1

## 2019-04-17 MED ORDER — TERBUTALINE SULFATE 1 MG/ML IJ SOLN
0.2500 mg | Freq: Once | INTRAMUSCULAR | Status: AC | PRN
  Administered 2019-04-19: 08:00:00 0.25 mg via SUBCUTANEOUS
  Filled 2019-04-17 (×2): qty 1

## 2019-04-17 MED ORDER — LIDOCAINE HCL (PF) 1 % IJ SOLN: 30.0000 mL | INTRAMUSCULAR | Status: DC | PRN

## 2019-04-17 MED ORDER — OXYTOCIN BOLUS FROM INFUSION: 500.0000 mL | Freq: Once | INTRAVENOUS | Status: DC

## 2019-04-17 MED ORDER — SOD CITRATE-CITRIC ACID 500-334 MG/5ML PO SOLN
30.0000 mL | ORAL | Status: DC | PRN
  Administered 2019-04-19: 08:00:00 30 mL via ORAL
  Filled 2019-04-17: qty 30

## 2019-04-17 MED ORDER — LACTATED RINGERS IV SOLN
500.0000 mL | INTRAVENOUS | Status: DC | PRN
  Administered 2019-04-18: 03:00:00 500 mL via INTRAVENOUS
  Administered 2019-04-19: 600 mL via INTRAVENOUS

## 2019-04-17 MED ORDER — OXYTOCIN 40 UNITS IN NORMAL SALINE INFUSION - SIMPLE MED: 2.5000 [IU]/h | INTRAVENOUS | Status: DC

## 2019-04-17 MED ORDER — LACTATED RINGERS IV SOLN
INTRAVENOUS | Status: DC
  Administered 2019-04-17 – 2019-04-19 (×9): via INTRAVENOUS

## 2019-04-17 MED ORDER — FENTANYL CITRATE (PF) 100 MCG/2ML IJ SOLN
100.0000 ug | INTRAMUSCULAR | Status: DC | PRN
  Administered 2019-04-17 – 2019-04-18 (×5): 100 ug via INTRAVENOUS
  Filled 2019-04-17 (×6): qty 2

## 2019-04-17 MED ORDER — MISOPROSTOL 25 MCG QUARTER TABLET
25.0000 ug | ORAL_TABLET | ORAL | Status: DC | PRN
  Administered 2019-04-17: 09:00:00 25 ug via VAGINAL
  Filled 2019-04-17 (×2): qty 1

## 2019-04-17 MED ORDER — ONDANSETRON HCL 4 MG/2ML IJ SOLN
4.0000 mg | Freq: Four times a day (QID) | INTRAMUSCULAR | Status: DC | PRN
  Administered 2019-04-17 – 2019-04-19 (×2): 4 mg via INTRAVENOUS
  Filled 2019-04-17: qty 2

## 2019-04-17 MED ORDER — ACETAMINOPHEN 325 MG PO TABS: 650.0000 mg | ORAL_TABLET | ORAL | Status: DC | PRN

## 2019-04-17 NOTE — Progress Notes (Addendum)
Labor Progress Note Amy Welch is a 33 y.o. G2P0000 at [redacted]w[redacted]d presented for IOL for post-dates.  S: Occasional contractions but overall comfortable.   O:  BP 120/80 (BP Location: Right Arm)   Pulse 61   Temp 98.2 F (36.8 C) (Oral)   Resp 20   Wt 86.9 kg   BMI 32.89 kg/m  EFM: 135, reactive, moderate variability, pos accels, no decels Ctx: Infrequent   CVE: Dilation: 1 Effacement (%): 50 Station: -3 Presentation: Vertex Exam by:: k. jones RN    A&P: 33 y.o. G2P0000 [redacted]w[redacted]d here for IOL for post-dates.  #Labor: S/p vaginal cytotec and now buccal Cytotec at this exam. Posterior cervix and unable to place Foley. BSUS confirmed vertex due to difficult assessment by vaginal exam.  #Pain: IV pain meds PRN; declines Epidural #FWB: Cat I #GBS negative  Chauncey Mann, MD 2:25 PM

## 2019-04-17 NOTE — Progress Notes (Signed)
Amy Welch is a 33 y.o. G2P0000 at [redacted]w[redacted]d presented for IOL for post-dates.   Subjective: Feels uncomfortable with FB in and is feeling pain in her back.   Objective: BP 124/77 (BP Location: Right Arm)   Pulse 62   Temp 98.2 F (36.8 C) (Oral)   Resp 20   Wt 86.9 kg   BMI 32.89 kg/m  No intake/output data recorded. No intake/output data recorded.  FHT:  FHR: 130 bpm, variability: moderate,  accelerations:  Abscent,  decelerations:  Absent UC:   regular, every 1.5-4 minutes SVE:   Dilation: 1 Effacement (%): 50 Station: -3 Exam by:: Dr. Marice Potter  Labs: Lab Results  Component Value Date   WBC 8.1 04/17/2019   HGB 13.0 04/17/2019   HCT 38.5 04/17/2019   MCV 90.8 04/17/2019   PLT 182 04/17/2019    Assessment / Plan: Amy Welch is a 33 y.o. G2P0000 at [redacted]w[redacted]d presented for IOL for post-dates.   Labor: Progressing normally s/p vaginal cytotec X 2 and FB in. Awaiting FB to fall out. Then can consider Pitocin and AROM.  Preeclampsia:  no signs or symptoms of toxicity Fetal Wellbeing:  Category I Pain Control:  IV pain meds I/D:  n/a Anticipated MOD:  NSVD anticipated, c-section if maternal/fetal indication.   Lattie Haw MD PGY-1, [facility] 04/17/2019, 5:02 PM

## 2019-04-17 NOTE — Progress Notes (Signed)
Labor Progress Note Cailynn Bodnar Vela Prose is a 33 y.o. G2P0000 at [redacted]w[redacted]d presented for IOL for post-dates.  S: Overall comfortable.   O:  BP 124/77 (BP Location: Right Arm)   Pulse 62   Temp 98.2 F (36.8 C) (Oral)   Resp 20   Wt 86.9 kg   BMI 32.89 kg/m  EFM: 135, reactive, moderate variability, pos accels, no decels Ctx: Infrequent   CVE: Dilation: 1 Effacement (%): 50 Station: -3 Presentation: Vertex Exam by:: Dr. Marice Potter   A&P: 33 y.o. G2P0000 [redacted]w[redacted]d here for IOL for post-dates.  #Labor: S/p vaginal cytotec and buccal Cytotec at this exam. Foley bulb placed. #Pain: IV pain meds PRN; declines Epidural at this time  #FWB: Cat I #GBS negative  Chauncey Mann, MD 4:21 PM

## 2019-04-17 NOTE — H&P (Addendum)
OBSTETRIC ADMISSION HISTORY AND PHYSICAL  *In person spanish speaking interpretor used*   Amy Welch is a 33 y.o. female G2P0000 with IUP at [redacted]w[redacted]d by [place] presenting for IOL for post dates.  Doing well. Seen in [facility] yesterday for routine follow up. Reports good fetal movement. Denies vaginal bleeding. Has some yellow/white discharge. Denies headache, visual changes, chest pain, SOB, cough, dizziness, RUQ pain, fevers, voiding sx or change in bowel habit. Has mild edema of ankles.  She received her prenatal care at [facility]  Support person in labor: Husband  Ultrasounds . Anatomy U/S: normal, female, vertex on 04/15/19  Prenatal History/Complications: . None  Past Medical History: History reviewed. No pertinent past medical history.  Past Surgical History: Past Surgical History:  Procedure Laterality Date  . NO PAST SURGERIES      Obstetrical History: OB History    Gravida  2   Para  0   Term  0   Preterm  0   AB  0   Living        SAB  0   TAB  0   Ectopic  0   Multiple      Live Births              Social History: Social History   Socioeconomic History  . Marital status: Single    Spouse name: Not on file  . Number of children: Not on file  . Years of education: Not on file  . Highest education level: Not on file  Occupational History  . Not on file  Social Needs  . Financial resource strain: Somewhat hard  . Food insecurity    Worry: Sometimes true    Inability: Sometimes true  . Transportation needs    Medical: No    Non-medical: No  Tobacco Use  . Smoking status: Former Smoker    Types: Cigarettes    Quit date: 12/17/2018    Years since quitting: 0.3  . Smokeless tobacco: Never Used  Substance and Sexual Activity  . Alcohol use: Not Currently    Comment: occasional   . Drug use: Never  . Sexual activity: Yes    Birth control/protection: None, Pill    Comment: last sexual encounter 2 weeks ago  Lifestyle  . Physical activity     Days per week: 4 days    Minutes per session: 20 min  . Stress: To some extent  Relationships  . Social Musician on phone: Not on file    Gets together: Not on file    Attends religious service: Not on file    Active member of club or organization: Not on file    Attends meetings of clubs or organizations: Not on file    Relationship status: Not on file  Other Topics Concern  . Not on file  Social History Narrative   ** Merged History Encounter **       ** Merged History Encounter **        Family History: History reviewed. No pertinent family history.  Allergies: Allergies  Allergen Reactions  . Chicken Allergy Nausea Only    Stomach pain   . Morphine And Related     Sweating and blurry vision  . Morphine And Related     Reports sweating, SOB and lightheaded   . Morphine And Related Nausea And Vomiting    Medications Prior to Admission  Medication Sig Dispense Refill Last Dose  . famotidine (PEPCID)  20 MG tablet Take 1 tablet (20 mg total) by mouth 2 (two) times daily. 30 tablet 0      Review of Systems  All systems reviewed and negative except as stated in HPI  Blood pressure 135/84, pulse 74, temperature 98.4 F (36.9 C), temperature source Oral, resp. rate 20, weight 86.9 kg, unknown if currently breastfeeding. General appearance: alert, cooperative and appears stated age Lungs: clear on auscultation, no crackles, no wheeze, no respiratory distress Heart: RRR, no rubs or gallops, normal cap refill  Abdomen: soft, non-tender; gravid abdomen, bowel sounds present Pelvic: as below  Extremities: Homans sign is negative, no sign of DVT. Mild bilateral pedal edema.  Presentation: cephalic Fetal monitoring: HR 130 bpm, mod variability, accels present, no decels Uterine activity: toco-every 14-18 mins, mild, relaxed uterus. Adequate resting time  Dilation: 1 Effacement (%): 50 Station: -3 Exam by:: Raliegh Ip jones RN  Prenatal labs: ABO, Rh: --/--/O  POS (09/04 5188) Antibody: NEG (09/04 4166) Rubella: <0.90 (06/05 1349) non immune  RPR: Non Reactive (06/29 1229)  HBsAg: Negative (06/05 1349)  HIV: Non Reactive (06/29 1229)  GBS:   negative  Glucola: fasting glucose 147, GTT 1 hr, 2hr and 3hr-97, 146 and 122 respectively. Genetic screening:  Declined   Prenatal Transfer Tool  Maternal Diabetes: No Genetic Screening: Declined Maternal Ultrasounds/Referrals: Normal Fetal Ultrasounds or other Referrals:  None Maternal Substance Abuse:  No Significant Maternal Medications:  None Significant Maternal Lab Results: None and Group B Strep negative  Results for orders placed or performed during the hospital encounter of 04/17/19 (from the past 24 hour(s))  CBC   Collection Time: 04/17/19  8:07 AM  Result Value Ref Range   WBC 8.1 4.0 - 10.5 K/uL   RBC 4.24 3.87 - 5.11 MIL/uL   Hemoglobin 13.0 12.0 - 15.0 g/dL   HCT 38.5 36.0 - 46.0 %   MCV 90.8 80.0 - 100.0 fL   MCH 30.7 26.0 - 34.0 pg   MCHC 33.8 30.0 - 36.0 g/dL   RDW 13.5 11.5 - 15.5 %   Platelets 182 150 - 400 K/uL   nRBC 0.0 0.0 - 0.2 %  Type and screen   Collection Time: 04/17/19  8:07 AM  Result Value Ref Range   ABO/RH(D) O POS    Antibody Screen NEG    Sample Expiration      04/20/2019,2359 Performed at [facility], [place] [address], [place], [place] [address]     Patient Active Problem List   Diagnosis Date Noted  . Labor and delivery, indication for care 04/17/2019  . Uterine size date discrepancy pregnancy, third trimester 02/24/2019  . Telephone language interpreter service required 02/24/2019  . Encounter for supervision of other normal pregnancy, third trimester 02/08/2019  . Rubella non-immune status, antepartum 02/02/2019    Assessment/Plan:  Amy Welch is a 33 y.o. G2P0000 at [redacted]w[redacted]d here for IOL for postdates.  Labor:  S/p cytotec X 1. Continue with PO cyotec every 4 hrs. Consider FB insertion when cervix allows. Pitocin/AROM if  necessary. -- pain control: would like IV pain meds, does not want epidural.   Fetal Wellbeing:  -- GBS (negative) -- continuous fetal monitoring    Postpartum Planning -- Feeding: breast and bottle --Circumcision desired-no --Tdap-7/2 --Contraception: nexplanon   Lattie Haw, MD PGY-1, [place] [facility]   I saw and evaluated the patient. I agree with the findings and the plan of care as documented in the resident's note. S/p Cytotec  [id number]. Could consider FB. Anticipate SVD. EFW 3300g. Anterior placenta.   Jerilynn Birkenheadhelsea Coretha Creswell, MD [facility] Fellow, [facility] for [facility], [facility]

## 2019-04-18 ENCOUNTER — Inpatient Hospital Stay (HOSPITAL_COMMUNITY): Payer: Medicaid Other | Admitting: Anesthesiology

## 2019-04-18 ENCOUNTER — Encounter (HOSPITAL_COMMUNITY): Payer: Self-pay

## 2019-04-18 MED ORDER — TERBUTALINE SULFATE 1 MG/ML IJ SOLN
0.2500 mg | Freq: Once | INTRAMUSCULAR | Status: AC | PRN
  Administered 2019-04-19: 0.25 mg via SUBCUTANEOUS

## 2019-04-18 MED ORDER — DIPHENHYDRAMINE HCL 50 MG/ML IJ SOLN: 12.5000 mg | INTRAMUSCULAR | Status: DC | PRN

## 2019-04-18 MED ORDER — EPHEDRINE 5 MG/ML INJ: 10.0000 mg | INTRAVENOUS | Status: DC | PRN

## 2019-04-18 MED ORDER — LIDOCAINE HCL (PF) 1 % IJ SOLN
INTRAMUSCULAR | Status: DC | PRN
  Administered 2019-04-18: 10 mL via EPIDURAL

## 2019-04-18 MED ORDER — PHENYLEPHRINE 40 MCG/ML (10ML) SYRINGE FOR IV PUSH (FOR BLOOD PRESSURE SUPPORT)
80.0000 ug | PREFILLED_SYRINGE | INTRAVENOUS | Status: DC | PRN
  Filled 2019-04-18: qty 10

## 2019-04-18 MED ORDER — FENTANYL-BUPIVACAINE-NACL 0.5-0.125-0.9 MG/250ML-% EP SOLN
12.0000 mL/h | EPIDURAL | Status: DC | PRN
  Administered 2019-04-19: 12 mL/h via EPIDURAL
  Filled 2019-04-18 (×2): qty 250

## 2019-04-18 MED ORDER — LACTATED RINGERS AMNIOINFUSION
INTRAVENOUS | Status: DC
  Administered 2019-04-18: 14:00:00 via INTRAUTERINE

## 2019-04-18 MED ORDER — SODIUM CHLORIDE (PF) 0.9 % IJ SOLN
INTRAMUSCULAR | Status: DC | PRN
  Administered 2019-04-18: 12 mL/h via EPIDURAL

## 2019-04-18 MED ORDER — PHENYLEPHRINE 40 MCG/ML (10ML) SYRINGE FOR IV PUSH (FOR BLOOD PRESSURE SUPPORT): 80.0000 ug | PREFILLED_SYRINGE | INTRAVENOUS | Status: DC | PRN

## 2019-04-18 MED ORDER — OXYTOCIN 40 UNITS IN NORMAL SALINE INFUSION - SIMPLE MED
1.0000 m[IU]/min | INTRAVENOUS | Status: DC
  Administered 2019-04-18: 2 m[IU]/min via INTRAVENOUS
  Administered 2019-04-19: 08:00:00 4 m[IU]/min via INTRAVENOUS
  Administered 2019-04-19: 03:00:00 2 m[IU]/min via INTRAVENOUS
  Filled 2019-04-18: qty 1000

## 2019-04-18 MED ORDER — LACTATED RINGERS IV SOLN
500.0000 mL | Freq: Once | INTRAVENOUS | Status: AC
  Administered 2019-04-18: 500 mL via INTRAVENOUS

## 2019-04-18 NOTE — Progress Notes (Signed)
Resting tone elevated, inadequate fluid return.  IUPC re-zero'd and tone remains in 50's.  Provider notified and RN instructed to turn amnioinfusion off

## 2019-04-18 NOTE — Progress Notes (Signed)
Labor Progress Note Vivien Barretto Vela Prose is a 33 y.o. G2P0000 at [redacted]w[redacted]d presented for IOL for postdates  S:  Patient comfortable with epidural. Thinks water broke around 11a  O:  BP 123/78   Pulse 75   Temp 98.6 F (37 C) (Axillary)   Resp 18   Wt 86.9 kg   SpO2 97%   BMI 32.89 kg/m   Fetal Tracing:  Baseline: 140 Variability: moderate Accels: 15x15 Decels: variable with contractions  Toco: 2-5   CVE: 5/80/-1   A&P: 33 y.o. G2P0000 [redacted]w[redacted]d IOL for postdates #Labor: Progressing well. SROM with meconium stained fluid. Discussed risks and benefits of IUPC placement for monitoring contractions more accurately. Patient agreeable to plan of care. Internal monitor placed without difficulty and patient tolerated procedure well. Will start amnioinfusion for repeated variables. #Pain: epidural #FWB: Cat 2 #GBS negative  Wende Mott, CNM 1:37 PM

## 2019-04-18 NOTE — Progress Notes (Signed)
Pain medication offerred and declined by pt 

## 2019-04-18 NOTE — Progress Notes (Signed)
Pt asked to remain Semi-fowler's as pain feels better in th is postion.

## 2019-04-18 NOTE — Progress Notes (Addendum)
Amy Welch a 33 y.o.G2P0000 at [redacted]w[redacted]d presented for IOL for post-dates.  Subjective: Feeling mild contractions   Objective: BP (!) 107/59   Pulse 79   Temp 98.4 F (36.9 C) (Oral)   Resp 18   Wt 86.9 kg   SpO2 98%   BMI 32.89 kg/m  No intake/output data recorded. No intake/output data recorded.  FHT:  FHR: 140 bpm, variability: moderate,  accelerations:  Present,  decelerations:  Present variable decels UC:   regular, every 2-3 minutes SVE:   Dilation: 7 Effacement (%): 80 Station: 0 Exam by:: s grindstaff rn   Labs: Lab Results  Component Value Date   WBC 8.1 04/17/2019   HGB 13.0 04/17/2019   HCT 38.5 04/17/2019   MCV 90.8 04/17/2019   PLT 182 04/17/2019    Assessment / Plan: Amy Welch a 33 y.o.G2P0000 at [redacted]w[redacted]d presented for IOL for post-dates.  Labor: s/p vaginal cytotec X 3. FB out. Started on pit. Pt having recurrent variables. SROM since last cervical check. Making good cervical change. Caput palpable. Cervix: 7, 80, 0. Continue Pitocin. Fetal Wellbeing:  Category II Pain Control:  Epidural and IV pain meds I/D:  n/a Anticipated MOD:  NSVD anticipated, Progress to c-section if maternal/fetal indication.  Lattie Haw MD PGY-1, [place] [facility]  04/18/2019, 4:50 PM

## 2019-04-18 NOTE — Anesthesia Procedure Notes (Signed)
Epidural Patient location during procedure: OB Start time: 04/18/2019 12:04 PM End time: 04/18/2019 12:15 PM  Staffing Anesthesiologist: Lidia Collum, MD Performed: anesthesiologist   Preanesthetic Checklist Completed: patient identified, pre-op evaluation, timeout performed, IV checked, risks and benefits discussed and monitors and equipment checked  Epidural Patient position: sitting Prep: DuraPrep Patient monitoring: heart rate, continuous pulse ox and blood pressure Approach: midline Location: L3-L4 Injection technique: LOR air  Needle:  Needle type: Tuohy  Needle gauge: 17 G Needle length: 9 cm Needle insertion depth: 5 cm Catheter type: closed end flexible Catheter size: 19 Gauge Catheter at skin depth: 10 cm Test dose: negative  Assessment Events: blood not aspirated, injection not painful, no injection resistance, negative IV test and no paresthesia  Additional Notes Reason for block:procedure for pain

## 2019-04-18 NOTE — Progress Notes (Addendum)
Amy Gasparini Lopezis a 33 y.o.G2P0000 at [redacted]w[redacted]d presented for IOL for post-dates.   Subjective: Pt feeling uncomfortable, feeling more contractions   Objective: BP 139/84   Pulse 78   Temp (!) 97.5 F (36.4 C)   Resp 16   Wt 86.9 kg   BMI 32.89 kg/m  No intake/output data recorded. No intake/output data recorded.  FHT:  FHR: 135 bpm, variability: moderate,  accelerations:  Present,  decelerations:  Present variable decels UC:   regular, every 2 minutes SVE:   Dilation: 5 Effacement (%): 50 Station: -3 Exam by:: C. Nightingale, RN  Labs: Lab Results  Component Value Date   WBC 8.1 04/17/2019   HGB 13.0 04/17/2019   HCT 38.5 04/17/2019   MCV 90.8 04/17/2019   PLT 182 04/17/2019    Assessment / Plan: Nicholas Ossa Lopezis a 33 y.o.G2P0000 at [redacted]w[redacted]d presented for IOL for post-dates.  Labor: s/p vaginal cytotec X 3. FB out. Pt having recurrent variables. No change since last cervical exam. Starting pitocin. Considering AROM.  Preeclampsia:  no signs or symptoms of toxicity Fetal Wellbeing:  Category II Pain Control:  IV pain meds consider epidural when in active labor I/D:  n/a gbs negative  Anticipated MOD:  NSVD anticipated. Progress to c-section if maternal/fetal indication.  Lattie Haw MD PGY-1, [place] [facility]  04/18/2019, 10:04 AM

## 2019-04-18 NOTE — Progress Notes (Signed)
Hospital Spanish interpreter in room

## 2019-04-18 NOTE — Anesthesia Preprocedure Evaluation (Addendum)
Anesthesia Evaluation  Patient identified by MRN, date of birth, ID band Patient awake    Reviewed: Allergy & Precautions, H&P , NPO status , Patient's Chart, lab work & pertinent test results  History of Anesthesia Complications Negative for: history of anesthetic complications  Airway Mallampati: II  TM Distance: >3 FB Neck ROM: full    Dental no notable dental hx.    Pulmonary neg pulmonary ROS, former smoker,    Pulmonary exam normal        Cardiovascular negative cardio ROS Normal cardiovascular exam Rhythm:regular Rate:Normal     Neuro/Psych negative neurological ROS  negative psych ROS   GI/Hepatic negative GI ROS, Neg liver ROS,   Endo/Other  negative endocrine ROS  Renal/GU negative Renal ROS  negative genitourinary   Musculoskeletal   Abdominal   Peds  Hematology negative hematology ROS (+)   Anesthesia Other Findings   Reproductive/Obstetrics (+) Pregnancy                             Anesthesia Physical Anesthesia Plan  ASA: II and emergent  Anesthesia Plan: Epidural   Post-op Pain Management:    Induction:   PONV Risk Score and Plan: 4 or greater and Treatment may vary due to age or medical condition, Ondansetron and Dexamethasone  Airway Management Planned: Natural Airway and Simple Face Mask  Additional Equipment: None  Intra-op Plan:   Post-operative Plan:   Informed Consent: I have reviewed the patients History and Physical, chart, labs and discussed the procedure including the risks, benefits and alternatives for the proposed anesthesia with the patient or authorized representative who has indicated his/her understanding and acceptance.     Dental advisory given  Plan Discussed with: CRNA  Anesthesia Plan Comments:        Anesthesia Quick Evaluation

## 2019-04-18 NOTE — Progress Notes (Signed)
Pain medication offerred and declined by pt

## 2019-04-18 NOTE — Progress Notes (Addendum)
Amy Lackman Lopezis a 33 y.o.G2P0000 at [redacted]w[redacted]d presented for IOL for post-dates.  Subjective: Sleepy, mild contractions   Objective: BP (!) 84/64   Pulse (!) 115   Temp 98.6 F (37 C)   Resp 20   Wt 86.9 kg   SpO2 98%   BMI 32.89 kg/m  I/O last 3 completed shifts: In: -  Out: 75 [Urine:75] No intake/output data recorded.  FHT:  FHR: 145 bpm, variability: moderate,  accelerations:  Present,  decelerations:  Present early and variables  UC:   regular, every 1-2 minutes SVE:   Dilation: 8 Effacement (%): 80 Station: 0 Exam by:: s grindstaff rn   Labs: Lab Results  Component Value Date   WBC 8.1 04/17/2019   HGB 13.0 04/17/2019   HCT 38.5 04/17/2019   MCV 90.8 04/17/2019   PLT 182 04/17/2019    Assessment / Plan: Amy Schiano Lopezis a 33 y.o.G2P0000 at [redacted]w[redacted]d presented for IOL for post-dates.  Labor: s/p vaginal cytotecX 3.FB out. Started on pit. Making good cervical change. Caput palpable. Continue Pitocin Fetal Wellbeing:  Category II closely monitoring Pain Control:  Epidural I/D:  n/a Anticipated MOD:  NSVD anticipated, c-section if maternal/fetal indication.   Lattie Haw  PGY-1, [place] 04/18/2019, 8:14 PM

## 2019-04-18 NOTE — Progress Notes (Signed)
Spanish Interpreter in room

## 2019-04-19 ENCOUNTER — Encounter (HOSPITAL_COMMUNITY)
Admission: AD | Disposition: A | Discharge status: Home / Self Care | Payer: Self-pay | Source: Home / Self Care | Attending: Obstetrics and Gynecology

## 2019-04-19 ENCOUNTER — Encounter (HOSPITAL_COMMUNITY): Payer: Self-pay | Admitting: *Deleted

## 2019-04-19 ENCOUNTER — Inpatient Hospital Stay (HOSPITAL_COMMUNITY): Payer: Medicaid Other

## 2019-04-19 LAB — CREATININE, SERUM
Creatinine, Ser: 1.81 mg/dL — ABNORMAL HIGH (ref 0.44–1.00)
GFR calc Af Amer: 42 mL/min — ABNORMAL LOW (ref >60)
GFR calc non Af Amer: 36 mL/min — ABNORMAL LOW (ref >60)

## 2019-04-19 LAB — COMPREHENSIVE METABOLIC PANEL
ALT: 15 U/L (ref 0–44)
AST: 41 U/L (ref 15–41)
Albumin: 1.6 g/dL — ABNORMAL LOW (ref 3.5–5.0)
Alkaline Phosphatase: 105 U/L (ref 38–126)
Anion gap: 10 (ref 5–15)
BUN: 16 mg/dL (ref 6–20)
CO2: 19 mmol/L — ABNORMAL LOW (ref 22–32)
Calcium: 7.6 mg/dL — ABNORMAL LOW (ref 8.9–10.3)
Chloride: 103 mmol/L (ref 98–111)
Creatinine, Ser: 1.6 mg/dL — ABNORMAL HIGH (ref 0.44–1.00)
GFR calc Af Amer: 49 mL/min — ABNORMAL LOW (ref >60)
GFR calc non Af Amer: 42 mL/min — ABNORMAL LOW (ref >60)
Glucose, Bld: 163 mg/dL — ABNORMAL HIGH (ref 70–99)
Potassium: 4.1 mmol/L (ref 3.5–5.1)
Sodium: 132 mmol/L — ABNORMAL LOW (ref 135–145)
Total Bilirubin: 0.6 mg/dL (ref 0.3–1.2)
Total Protein: 4.2 g/dL — ABNORMAL LOW (ref 6.5–8.1)

## 2019-04-19 LAB — URINALYSIS, ROUTINE W REFLEX MICROSCOPIC
Bilirubin Urine: NEGATIVE
Glucose, UA: NEGATIVE mg/dL
Ketones, ur: NEGATIVE mg/dL
Nitrite: NEGATIVE
Protein, ur: 100 mg/dL — AB
RBC / HPF: >50 RBC/hpf — ABNORMAL HIGH (ref 0–5)
Specific Gravity, Urine: 1.021 (ref 1.005–1.030)
WBC, UA: >50 WBC/hpf — ABNORMAL HIGH (ref 0–5)
pH: 5 (ref 5.0–8.0)

## 2019-04-19 LAB — CBC
HCT: 32.7 % — ABNORMAL LOW (ref 36.0–46.0)
HCT: 30.6 % — ABNORMAL LOW (ref 36.0–46.0)
Hemoglobin: 11 g/dL — ABNORMAL LOW (ref 12.0–15.0)
Hemoglobin: 10.5 g/dL — ABNORMAL LOW (ref 12.0–15.0)
MCH: 31.3 pg (ref 26.0–34.0)
MCH: 31.2 pg (ref 26.0–34.0)
MCHC: 33.6 g/dL (ref 30.0–36.0)
MCHC: 34.3 g/dL (ref 30.0–36.0)
MCV: 92.9 fL (ref 80.0–100.0)
MCV: 90.8 fL (ref 80.0–100.0)
Platelets: 151 10*3/uL (ref 150–400)
Platelets: 142 10*3/uL — ABNORMAL LOW (ref 150–400)
RBC: 3.52 MIL/uL — ABNORMAL LOW (ref 3.87–5.11)
RBC: 3.37 MIL/uL — ABNORMAL LOW (ref 3.87–5.11)
RDW: 13.9 % (ref 11.5–15.5)
RDW: 13.7 % (ref 11.5–15.5)
WBC: 20.5 10*3/uL — ABNORMAL HIGH (ref 4.0–10.5)
WBC: 26.4 10*3/uL — ABNORMAL HIGH (ref 4.0–10.5)
nRBC: 0 % (ref 0.0–0.2)
nRBC: 0 % (ref 0.0–0.2)

## 2019-04-19 LAB — SODIUM, URINE, RANDOM: Sodium, Ur: 20 mmol/L

## 2019-04-19 LAB — CREATININE, URINE, RANDOM: Creatinine, Urine: 270.46 mg/dL

## 2019-04-19 SURGERY — Surgical Case
Anesthesia: Epidural

## 2019-04-19 MED ORDER — SODIUM CHLORIDE 0.9% FLUSH: 3.0000 mL | INTRAVENOUS | Status: DC | PRN

## 2019-04-19 MED ORDER — KETOROLAC TROMETHAMINE 30 MG/ML IJ SOLN
30.0000 mg | Freq: Four times a day (QID) | INTRAMUSCULAR | Status: AC | PRN
  Administered 2019-04-19: 30 mg via INTRAMUSCULAR

## 2019-04-19 MED ORDER — IOHEXOL 300 MG/ML  SOLN
50.0000 mL | Freq: Once | INTRAMUSCULAR | Status: AC | PRN
  Administered 2019-04-19: 22:00:00 50 mL via URETHRAL

## 2019-04-19 MED ORDER — SODIUM CHLORIDE 0.9 % IV SOLN
INTRAVENOUS | Status: DC | PRN
  Administered 2019-04-19: 09:00:00 via INTRAVENOUS

## 2019-04-19 MED ORDER — SODIUM CHLORIDE 0.9 % IV SOLN
2.0000 g | Freq: Once | INTRAVENOUS | Status: DC
  Filled 2019-04-19: qty 2000

## 2019-04-19 MED ORDER — DIPHENHYDRAMINE HCL 50 MG/ML IJ SOLN: 25.0000 mg | Freq: Once | INTRAMUSCULAR | Status: DC

## 2019-04-19 MED ORDER — NALBUPHINE HCL 10 MG/ML IJ SOLN
5.0000 mg | Freq: Once | INTRAMUSCULAR | Status: DC | PRN
  Filled 2019-04-19: qty 0.5

## 2019-04-19 MED ORDER — MORPHINE SULFATE (PF) 0.5 MG/ML IJ SOLN
INTRAMUSCULAR | Status: DC | PRN
  Administered 2019-04-19: 2 mg via EPIDURAL

## 2019-04-19 MED ORDER — DIBUCAINE (PERIANAL) 1 % EX OINT: 1.0000 "application " | TOPICAL_OINTMENT | CUTANEOUS | Status: DC | PRN

## 2019-04-19 MED ORDER — KETOROLAC TROMETHAMINE 30 MG/ML IJ SOLN
30.0000 mg | Freq: Four times a day (QID) | INTRAMUSCULAR | Status: DC
  Administered 2019-04-19 – 2019-04-20 (×4): 30 mg via INTRAVENOUS
  Filled 2019-04-19 (×4): qty 1

## 2019-04-19 MED ORDER — NALOXONE HCL 0.4 MG/ML IJ SOLN: 0.4000 mg | INTRAMUSCULAR | Status: DC | PRN

## 2019-04-19 MED ORDER — LIDOCAINE-EPINEPHRINE (PF) 2 %-1:200000 IJ SOLN
INTRAMUSCULAR | Status: AC
  Filled 2019-04-19: qty 10

## 2019-04-19 MED ORDER — SODIUM CHLORIDE 0.9 % IV SOLN
500.0000 mg | INTRAVENOUS | Status: DC
  Administered 2019-04-19: 500 mg via INTRAVENOUS
  Filled 2019-04-19: qty 500

## 2019-04-19 MED ORDER — PHENYLEPHRINE HCL (PRESSORS) 10 MG/ML IV SOLN
INTRAVENOUS | Status: DC | PRN
  Administered 2019-04-19: 80 ug via INTRAVENOUS

## 2019-04-19 MED ORDER — SODIUM CHLORIDE 0.9 % IV SOLN
INTRAVENOUS | Status: DC | PRN
  Administered 2019-04-19: 40 [IU] via INTRAVENOUS

## 2019-04-19 MED ORDER — PHENYLEPHRINE 40 MCG/ML (10ML) SYRINGE FOR IV PUSH (FOR BLOOD PRESSURE SUPPORT)
PREFILLED_SYRINGE | INTRAVENOUS | Status: DC | PRN
  Administered 2019-04-19 (×10): 80 ug via INTRAVENOUS

## 2019-04-19 MED ORDER — DIPHENHYDRAMINE HCL 25 MG PO CAPS: 25.0000 mg | ORAL_CAPSULE | Freq: Four times a day (QID) | ORAL | Status: DC | PRN

## 2019-04-19 MED ORDER — FENTANYL CITRATE (PF) 100 MCG/2ML IJ SOLN: 25.0000 ug | INTRAMUSCULAR | Status: DC | PRN

## 2019-04-19 MED ORDER — ACETAMINOPHEN 500 MG PO TABS
1000.0000 mg | ORAL_TABLET | Freq: Four times a day (QID) | ORAL | Status: DC
  Administered 2019-04-19 – 2019-04-22 (×11): 1000 mg via ORAL
  Filled 2019-04-19 (×11): qty 2

## 2019-04-19 MED ORDER — NALBUPHINE HCL 10 MG/ML IJ SOLN
5.0000 mg | INTRAMUSCULAR | Status: DC | PRN
  Filled 2019-04-19: qty 0.5

## 2019-04-19 MED ORDER — DEXAMETHASONE SODIUM PHOSPHATE 10 MG/ML IJ SOLN
INTRAMUSCULAR | Status: DC | PRN
  Administered 2019-04-19: 10 mg via INTRAVENOUS

## 2019-04-19 MED ORDER — ACETAMINOPHEN 500 MG PO TABS: 1000.0000 mg | ORAL_TABLET | Freq: Four times a day (QID) | ORAL | Status: DC

## 2019-04-19 MED ORDER — COCONUT OIL OIL
1.0000 "application " | TOPICAL_OIL | Status: DC | PRN
  Administered 2019-04-19: 1 via TOPICAL

## 2019-04-19 MED ORDER — CEFAZOLIN SODIUM-DEXTROSE 2-3 GM-%(50ML) IV SOLR
INTRAVENOUS | Status: DC | PRN
  Administered 2019-04-19: 2 g via INTRAVENOUS

## 2019-04-19 MED ORDER — MENTHOL 3 MG MT LOZG: 1.0000 | LOZENGE | OROMUCOSAL | Status: DC | PRN

## 2019-04-19 MED ORDER — KETOROLAC TROMETHAMINE 30 MG/ML IJ SOLN
INTRAMUSCULAR | Status: AC
  Filled 2019-04-19: qty 1

## 2019-04-19 MED ORDER — SODIUM CHLORIDE 0.9 % IR SOLN
Status: DC | PRN
  Administered 2019-04-19: 1

## 2019-04-19 MED ORDER — OXYTOCIN 40 UNITS IN NORMAL SALINE INFUSION - SIMPLE MED
INTRAVENOUS | Status: AC
  Filled 2019-04-19: qty 1000

## 2019-04-19 MED ORDER — KETOROLAC TROMETHAMINE 30 MG/ML IJ SOLN: 30.0000 mg | Freq: Four times a day (QID) | INTRAMUSCULAR | Status: AC | PRN

## 2019-04-19 MED ORDER — KETOROLAC TROMETHAMINE 30 MG/ML IJ SOLN: 30.0000 mg | Freq: Once | INTRAMUSCULAR | Status: DC

## 2019-04-19 MED ORDER — PHENYLEPHRINE 40 MCG/ML (10ML) SYRINGE FOR IV PUSH (FOR BLOOD PRESSURE SUPPORT)
PREFILLED_SYRINGE | INTRAVENOUS | Status: AC
  Filled 2019-04-19: qty 10

## 2019-04-19 MED ORDER — TETANUS-DIPHTH-ACELL PERTUSSIS 5-2.5-18.5 LF-MCG/0.5 IM SUSP: 0.5000 mL | Freq: Once | INTRAMUSCULAR | Status: DC

## 2019-04-19 MED ORDER — BUPIVACAINE HCL (PF) 0.5 % IJ SOLN
INTRAMUSCULAR | Status: DC | PRN
  Administered 2019-04-19: 30 mL

## 2019-04-19 MED ORDER — DIPHENHYDRAMINE HCL 50 MG/ML IJ SOLN: 12.5000 mg | INTRAMUSCULAR | Status: DC | PRN

## 2019-04-19 MED ORDER — LACTATED RINGERS IV SOLN
INTRAVENOUS | Status: DC
  Administered 2019-04-19 – 2019-04-20 (×2): via INTRAVENOUS

## 2019-04-19 MED ORDER — LIDOCAINE-EPINEPHRINE (PF) 2 %-1:200000 IJ SOLN
INTRAMUSCULAR | Status: DC | PRN
  Administered 2019-04-19: 7 mL via EPIDURAL
  Administered 2019-04-19: 3 mL via EPIDURAL
  Administered 2019-04-19: 5 mL via EPIDURAL

## 2019-04-19 MED ORDER — LACTATED RINGERS IV BOLUS
1000.0000 mL | Freq: Once | INTRAVENOUS | Status: AC
  Administered 2019-04-19: 17:00:00 1000 mL via INTRAVENOUS

## 2019-04-19 MED ORDER — OXYTOCIN 40 UNITS IN NORMAL SALINE INFUSION - SIMPLE MED: 1.0000 m[IU]/min | INTRAVENOUS | Status: DC

## 2019-04-19 MED ORDER — OXYCODONE HCL 5 MG PO TABS
5.0000 mg | ORAL_TABLET | ORAL | Status: DC | PRN
  Administered 2019-04-20 – 2019-04-22 (×3): 5 mg via ORAL
  Filled 2019-04-19 (×3): qty 1

## 2019-04-19 MED ORDER — CEFAZOLIN SODIUM-DEXTROSE 2-4 GM/100ML-% IV SOLN
INTRAVENOUS | Status: AC
  Filled 2019-04-19: qty 100

## 2019-04-19 MED ORDER — BUPIVACAINE HCL (PF) 0.5 % IJ SOLN
INTRAMUSCULAR | Status: AC
  Filled 2019-04-19: qty 30

## 2019-04-19 MED ORDER — SENNOSIDES-DOCUSATE SODIUM 8.6-50 MG PO TABS
2.0000 | ORAL_TABLET | ORAL | Status: DC
  Administered 2019-04-20: 2 via ORAL
  Filled 2019-04-19: qty 2

## 2019-04-19 MED ORDER — ENOXAPARIN SODIUM 40 MG/0.4ML ~~LOC~~ SOLN
40.0000 mg | SUBCUTANEOUS | Status: DC
  Administered 2019-04-20 – 2019-04-22 (×3): 40 mg via SUBCUTANEOUS
  Filled 2019-04-19 (×3): qty 0.4

## 2019-04-19 MED ORDER — OXYTOCIN 40 UNITS IN NORMAL SALINE INFUSION - SIMPLE MED: 2.5000 [IU]/h | INTRAVENOUS | Status: DC

## 2019-04-19 MED ORDER — ACETAMINOPHEN 500 MG PO TABS: 1000.0000 mg | ORAL_TABLET | Freq: Once | ORAL | Status: DC

## 2019-04-19 MED ORDER — MORPHINE SULFATE (PF) 0.5 MG/ML IJ SOLN
INTRAMUSCULAR | Status: AC
  Filled 2019-04-19: qty 10

## 2019-04-19 MED ORDER — SODIUM BICARBONATE 8.4 % IV SOLN
INTRAVENOUS | Status: AC
  Filled 2019-04-19: qty 50

## 2019-04-19 MED ORDER — WITCH HAZEL-GLYCERIN EX PADS: 1.0000 "application " | MEDICATED_PAD | CUTANEOUS | Status: DC | PRN

## 2019-04-19 MED ORDER — SIMETHICONE 80 MG PO CHEW
80.0000 mg | CHEWABLE_TABLET | ORAL | Status: DC
  Administered 2019-04-20 – 2019-04-21 (×3): 80 mg via ORAL
  Filled 2019-04-19 (×3): qty 1

## 2019-04-19 MED ORDER — IBUPROFEN 800 MG PO TABS: 800.0000 mg | ORAL_TABLET | Freq: Three times a day (TID) | ORAL | Status: DC | PRN

## 2019-04-19 MED ORDER — ZOLPIDEM TARTRATE 5 MG PO TABS: 5.0000 mg | ORAL_TABLET | Freq: Every evening | ORAL | Status: DC | PRN

## 2019-04-19 MED ORDER — FUROSEMIDE 10 MG/ML IJ SOLN
20.0000 mg | Freq: Once | INTRAMUSCULAR | Status: AC
  Administered 2019-04-19: 19:00:00 20 mg via INTRAVENOUS
  Filled 2019-04-19: qty 2

## 2019-04-19 MED ORDER — PROMETHAZINE HCL 25 MG/ML IJ SOLN: 6.2500 mg | INTRAMUSCULAR | Status: DC | PRN

## 2019-04-19 MED ORDER — MEASLES, MUMPS & RUBELLA VAC IJ SOLR
0.5000 mL | Freq: Once | INTRAMUSCULAR | Status: AC
  Administered 2019-04-20: 18:00:00 0.5 mL via SUBCUTANEOUS
  Filled 2019-04-19: qty 0.5

## 2019-04-19 MED ORDER — TERBUTALINE SULFATE 1 MG/ML IJ SOLN: 0.2500 mg | Freq: Once | INTRAMUSCULAR | Status: DC | PRN

## 2019-04-19 MED ORDER — NALOXONE HCL 4 MG/10ML IJ SOLN
1.0000 ug/kg/h | INTRAVENOUS | Status: DC | PRN
  Filled 2019-04-19: qty 5

## 2019-04-19 MED ORDER — ACETAMINOPHEN 160 MG/5ML PO SOLN: 1000.0000 mg | Freq: Once | ORAL | Status: DC

## 2019-04-19 MED ORDER — SIMETHICONE 80 MG PO CHEW
80.0000 mg | CHEWABLE_TABLET | Freq: Three times a day (TID) | ORAL | Status: DC
  Administered 2019-04-19 – 2019-04-22 (×9): 80 mg via ORAL
  Filled 2019-04-19 (×9): qty 1

## 2019-04-19 MED ORDER — ONDANSETRON HCL 4 MG/2ML IJ SOLN: 4.0000 mg | Freq: Three times a day (TID) | INTRAMUSCULAR | Status: DC | PRN

## 2019-04-19 MED ORDER — PRENATAL MULTIVITAMIN CH
1.0000 | ORAL_TABLET | Freq: Every day | ORAL | Status: DC
  Administered 2019-04-20 – 2019-04-22 (×3): 1 via ORAL
  Filled 2019-04-19 (×3): qty 1

## 2019-04-19 MED ORDER — SIMETHICONE 80 MG PO CHEW: 80.0000 mg | CHEWABLE_TABLET | ORAL | Status: DC | PRN

## 2019-04-19 MED ORDER — ONDANSETRON HCL 4 MG/2ML IJ SOLN
INTRAMUSCULAR | Status: AC
  Filled 2019-04-19: qty 2

## 2019-04-19 MED ORDER — FENTANYL CITRATE (PF) 100 MCG/2ML IJ SOLN
INTRAMUSCULAR | Status: DC | PRN
  Administered 2019-04-19: 100 ug via EPIDURAL

## 2019-04-19 MED ORDER — DIPHENHYDRAMINE HCL 25 MG PO CAPS: 25.0000 mg | ORAL_CAPSULE | ORAL | Status: DC | PRN

## 2019-04-19 SURGICAL SUPPLY — 39 items
BARRIER ADHS 3X4 INTERCEED (GAUZE/BANDAGES/DRESSINGS) IMPLANT
BENZOIN TINCTURE PRP APPL 2/3 (GAUZE/BANDAGES/DRESSINGS) ×3 IMPLANT
CHLORAPREP W/TINT 26ML (MISCELLANEOUS) ×3 IMPLANT
CLAMP CORD UMBIL (MISCELLANEOUS) IMPLANT
DRSG OPSITE POSTOP 4X10 (GAUZE/BANDAGES/DRESSINGS) ×3 IMPLANT
ELECT REM PT RETURN 9FT ADLT (ELECTROSURGICAL) ×3
ELECTRODE REM PT RTRN 9FT ADLT (ELECTROSURGICAL) ×1 IMPLANT
EXTRACTOR VACUUM KIWI (MISCELLANEOUS) IMPLANT
GLOVE BIO SURGEON STRL SZ 6.5 (GLOVE) ×2 IMPLANT
GLOVE BIO SURGEONS STRL SZ 6.5 (GLOVE) ×1
GLOVE BIOGEL PI IND STRL 7.0 (GLOVE) ×1 IMPLANT
GLOVE BIOGEL PI INDICATOR 7.0 (GLOVE) ×2
GOWN STRL REUS W/ TWL LRG LVL3 (GOWN DISPOSABLE) ×2 IMPLANT
GOWN STRL REUS W/TWL LRG LVL3 (GOWN DISPOSABLE) ×4
HEMOSTAT ARISTA ABSORB 3G PWDR (HEMOSTASIS) ×3 IMPLANT
KIT ABG SYR 3ML LUER SLIP (SYRINGE) IMPLANT
NEEDLE HYPO 25X5/8 SAFETYGLIDE (NEEDLE) IMPLANT
NEEDLE SPNL 18GX3.5 QUINCKE PK (NEEDLE) ×3 IMPLANT
NS IRRIG 1000ML POUR BTL (IV SOLUTION) ×3 IMPLANT
PACK C SECTION WH (CUSTOM PROCEDURE TRAY) ×3 IMPLANT
PAD OB MATERNITY 4.3X12.25 (PERSONAL CARE ITEMS) ×3 IMPLANT
PENCIL SMOKE EVAC W/HOLSTER (ELECTROSURGICAL) ×3 IMPLANT
SPONGE LAP 18X18 RF (DISPOSABLE) ×9 IMPLANT
SUT PDS AB 0 CTX 60 (SUTURE) IMPLANT
SUT PLAIN 2 0 XLH (SUTURE) ×3 IMPLANT
SUT VIC AB 0 CT1 27 (SUTURE)
SUT VIC AB 0 CT1 27XBRD ANBCTR (SUTURE) IMPLANT
SUT VIC AB 0 CT1 36 (SUTURE) IMPLANT
SUT VIC AB 2-0 CT1 27 (SUTURE) ×2
SUT VIC AB 2-0 CT1 TAPERPNT 27 (SUTURE) ×1 IMPLANT
SUT VIC AB 2-0 CTX 36 (SUTURE) ×6 IMPLANT
SUT VIC AB 3-0 CT1 27 (SUTURE) ×2
SUT VIC AB 3-0 CT1 TAPERPNT 27 (SUTURE) ×1 IMPLANT
SUT VIC AB 3-0 SH 27 (SUTURE)
SUT VIC AB 3-0 SH 27X BRD (SUTURE) IMPLANT
SUT VIC AB 4-0 KS 27 (SUTURE) ×3 IMPLANT
SYR 30ML LL (SYRINGE) ×3 IMPLANT
TOWEL OR 17X24 6PK STRL BLUE (TOWEL DISPOSABLE) ×3 IMPLANT
TRAY FOLEY BAG SILVER LF 14FR (SET/KITS/TRAYS/PACK) ×3 IMPLANT

## 2019-04-19 NOTE — Op Note (Signed)
04/17/2019 - 04/19/2019  9:19 AM  PATIENT:  Amy Welch  33 y.o. female  PRE-OPERATIVE DIAGNOSIS:  Nonreassuring fetal heart rate  POST-OPERATIVE DIAGNOSIS:  Nonreassuring fetal heart rate  PROCEDURE:  Procedure(s): CESAREAN SECTION (N/A)   FINDINGS: living female infant, Apgars and weight pending Cord gas 7.02 Placenta stained with meconium, foul smelling Bloodly urine in Foley prior to incision  SURGEON:  Surgeon(s) and Role:    * Lavarius Doughten C, MD - Primary    * Sparacino, Hailey L, DO - Fellow   ANESTHESIA:   local and epidural  EBL:  812  BLOOD ADMINISTERED:none  DRAINS: Urinary Catheter (Suprapubic)   LOCAL MEDICATIONS USED:  MARCAINE     SPECIMEN:  Source of Specimen:  cord blood, placenta  DISPOSITION OF SPECIMEN:  PATHOLOGY  COUNTS:  YES  TOURNIQUET:  * No tourniquets in log *  DICTATION: .Dragon Dictation  PLAN OF CARE:   PATIENT DISPOSITION:  PACU - hemodynamically stable.   Delay start of Pharmacological VTE agent (>24hrs) due to surgical blood loss or risk of bleeding: not applicable  The risks, benefits, and alternatives of surgery were explained, understood, accepted. Consents were signed. All questions were answered. In the L&D room epidural anesthesia was applied without complication. Her abdomen and vagina were prepped and draped in the usual sterile fashion. A Foley catheter was placed, draining bloody urine throughout case. Timeout procedure was done. After adequate anesthesia was assured 30 mL for 0.5% Marcaine was injected into the subcutaneous tissue at the site of her previous cesarean. An incision was made about 2 cm above the symphysis pubis. The incision was carried down through the subcutaneous tissue to the fascia. The fascia was scored the midline and extended bilaterally. The middle 50% of the rectus muscles were separated in a transverse fashion using electrosurgical technique. Excellent hemostasis was maintained. The peritoneum was  entered with hemostats. A moderate amount of ascitic fluid was noted. Peritoneal incision was extended bilaterally with the Bovie. The bladder blade was placed. A transverse incision was made on the well-developed lower uterine segment. The uterine incision was extended with traction on each side. Amniotomy was performed with a hemostat.  Foul smelling meconium fluid was noted. The baby was delivered from a vertex presentation with the aid of an assistant pushing the vertex upwards. A loose nuchal cord was reduced. The baby's cord was clamped and cut and was transferred to the NICU personnel for care. A cord gas was obtained.  The placenta was delivered intact with traction. The uterus was left in situ and the interior was cleaned with a dry lap sponge. The uterine incision was closed with 2-0 Vicryl running locking suture. Excellent hemostasis was noted. By tilting the uterus each side was able to visualize the adnexa, and they were normal. I placed Arista over the uterine incision. The rectus fascia rectus muscles were noted be hemostatic as well. The fascia was closed with a 0 vicryl suture in a running nonlocking fashion. No defects were palpable. The subcutaneous tissue was irrigated, clean, and dried. The adipose tissue was closed with interrupted plain gut suture. A subcuticular closure was done with a 3-0 Vicryl suture.  Excellent cosmetic results were obtained. She was taken to the recovery room in stable condition. She tolerated the procedure well.

## 2019-04-19 NOTE — Discharge Summary (Signed)
OB Discharge Summary     Patient Name: Amy Welch DOB: [birth year] MRN: [id number]  Date of admission: 04/17/2019 Delivering MD: Clovia Cuff C   Date of discharge: 04/22/2019  Admitting diagnosis: pregnancy Intrauterine pregnancy: [redacted]w[redacted]d    Secondary diagnosis:  Principal Problem:   Post-dates pregnancy Active Problems:   Rubella non-immune status, antepartum   Encounter for supervision of other normal pregnancy, third trimester   Telephone language interpreter service required   Labor and delivery, indication for care   Postoperative anemia   Decreased urine output   AKI (acute kidney injury) ([place]  Additional problems: IOL 2/2 post dates     Discharge diagnosis: Term Pregnancy Delivered                                                                                                Post partum procedures:MMR vaccine  Augmentation: Pitocin, Cytotec and Foley Balloon  Complications: None  Hospital course:  Induction of Labor With Cesarean Section  33y.o. yo G1P1001 at 41w4das admitted to the hospital 04/17/2019 for induction of labor for post-dates. She received cytotec, a foley bulb, and pitocin. [facility]. An amnioinfusion was started for repeated variables. Patient had a labor course significant for persistent late and variable decelerations. Forceps assisted delivery attempted however no pulls made due to inarticulation of forceps, one pull with vacuum with no descent and patient consented for c-section. The patient subsequently went for cesarean section due to Non-Reassuring FHR, and delivered a Viable infant,04/19/2019  Membrane Rupture Time/Date: 11:57 AM ,04/18/2019   Details of operation can be found in separate operative Note.  Patient had a postpartum course complicated by AKI and poor urine output which improved with fluid resuscitation and normalization of her creatinine by POD#3. She is ambulating, tolerating a regular diet,  passing flatus, and urinating well.  Patient is discharged home in stable condition on 04/22/19.  Home instructions given  Interview done via SpPatent attorney                                 Physical exam  Vitals:   04/21/19 2007 04/21/19 2307 04/22/19 0358 04/22/19 0800  BP: 115/74 126/81 131/75 121/90  Pulse: 74 69 (!) 57 74  Resp: '20 18 16 18  ' Temp: 98.3 F (36.8 C) 98.6 F (37 C) (!) 97.5 F (36.4 C) 98 F (36.7 C)  TempSrc: Oral Oral Oral Oral  SpO2: 97% 98% 97% 99%  Weight:      Height:       BP 121/90 (BP Location: Right Arm)   Pulse 74   Temp 98 F (36.7 C) (Oral)   Resp 18   Ht '5\' 5"'  (1.651 m)   Wt 82.6 kg   SpO2 99%   Breastfeeding Unknown   BMI 30.31 kg/m   Physical Exam:  General: alert, oriented, cooperative Chest: normal respiratory effort Heart: RRR  Abdomen: soft, appropriately tender to palpation, incision covered by dressing with no evidence of active  bleeding  Uterine Fundus: firm, at level of the umbilicus Lochia: moderate, rubra DVT Evaluation: no evidence of DVT Extremities: +1 edema bilaterally to mid calf, no calf tenderness   Labs: Lab Results  Component Value Date   WBC 18.8 (H) 04/21/2019   HGB 10.0 (L) 04/21/2019   HCT 30.0 (L) 04/21/2019   MCV 91.5 04/21/2019   PLT 171 04/21/2019   CMP Latest Ref Rng & Units 04/22/2019  Glucose 70 - 99 mg/dL 89  BUN 6 - 20 mg/dL 14  Creatinine 0.44 - 1.00 mg/dL 0.76  Sodium 135 - 145 mmol/L 139  Potassium 3.5 - 5.1 mmol/L 4.2  Chloride 98 - 111 mmol/L 110  CO2 22 - 32 mmol/L 21(L)  Calcium 8.9 - 10.3 mg/dL 8.0(L)  Total Protein 6.5 - 8.1 g/dL -  Total Bilirubin 0.3 - 1.2 mg/dL -  Alkaline Phos 38 - 126 U/L -  AST 15 - 41 U/L -  ALT 0 - 44 U/L -    Discharge instruction: per After Visit Summary and "Baby and Me Booklet".  After visit meds:  Allergies as of 04/22/2019      Reactions   Morphine And Related    Reports sweating, SOB and lightheaded    Morphine And Related Nausea And  Vomiting      Medication List    STOP taking these medications   famotidine 20 MG tablet Commonly known as: Pepcid     TAKE these medications   ibuprofen 600 MG tablet Commonly known as: ADVIL Take 1 tablet (600 mg total) by mouth every 8 (eight) hours as needed.   oxyCODONE 5 MG immediate release tablet Commonly known as: Oxy IR/ROXICODONE Take 1-2 tablets (5-10 mg total) by mouth every 4 (four) hours as needed for moderate pain or severe pain.   prenatal multivitamin Tabs tablet Take 1 tablet by mouth daily at 12 noon.       Diet: routine diet  Activity: Advance as tolerated. Pelvic rest for 6 weeks.   Outpatient follow up:4 weeks   Please schedule this patient for Postpartum visit in: 4 weeks with the following provider: Any provider  For C/S patients schedule nurse incision check in weeks 2 weeks: yes  Low risk pregnancy complicated by: arrest of descent  Delivery mode: CS  Anticipated Birth Control: Nexplanon  PP Procedures needed: Incision check  Schedule Integrated BH visit: no   Follow up Appt:No future appointments. Follow up Visit:No follow-ups on file.  Postpartum contraception: Nexplanon  Newborn Data: Live born female  Birth Weight: 8 lb 11 oz (3940 g) APGAR: 2, 6  Newborn Delivery   Birth date/time: 04/19/2019 08:55:00 Delivery type: C-Section, Low Transverse C-section categorization: Primary      Baby Feeding: Breast Disposition:NICU   04/22/2019 Sloan Leiter, MD

## 2019-04-19 NOTE — Progress Notes (Signed)
Forceps Attempted not successful

## 2019-04-19 NOTE — Progress Notes (Signed)
Attempted placement of forceps

## 2019-04-19 NOTE — Progress Notes (Signed)
Dr.j Hulan Fray notified of Urine output of 10cc for last hour.  Orders received.

## 2019-04-19 NOTE — Progress Notes (Signed)
OB Note Patient assessed and felt to be LOA and anterior lip with category II tracing due to persistent late and variable decelerations but with good recovery and minimal to moderate variabilty in between. Patient able to push past and anterior lip reduced. Patient moved fetus to +3 and foley removed. Pelvis felt to be adequate and EFW 3500gm. Pitocin restarted due to UCs only q50m. With interpreter, d/w pt re: forceps delivery and pt amenable to trial. Anesthesia re bolused epidural and peds present. I attempted placement of tucker mcclane forceps x 2 but they would not articulate properly. Patient with decent pushing on 3rd and 4th push with BPD to +3, mild caput.  Since could not get forceps to articulate, I asked Dr. Hulan Fray to assess for trial of operative delivery. Care relinquished to Dr. Hulan Fray.    Durene Romans MD Attending Center for [employer] (Faculty Practice) 04/19/2019 Time: [phone]

## 2019-04-19 NOTE — Transfer of Care (Signed)
Immediate Anesthesia Transfer of Care Note  Patient: Amy Welch  Procedure(s) Performed: CESAREAN SECTION (N/A )  Patient Location: PACU  Anesthesia Type:Epidural  Level of Consciousness: drowsy  Airway & Oxygen Therapy: Patient Spontanous Breathing  Post-op Assessment: Report given to RN  Post vital signs: Reviewed and stable  Last Vitals:  Vitals Value Taken Time  BP 94/45 04/19/19 0945  Temp    Pulse 114 04/19/19 0950  Resp 33 04/19/19 0950  SpO2 95 % 04/19/19 0950  Vitals shown include unvalidated device data.  Last Pain:  Vitals:   04/19/19 0700  TempSrc:   PainSc: 0-No pain      Patients Stated Pain Goal: 4 (55/[id number]/41 [id number])  Complications: No apparent anesthesia complications

## 2019-04-19 NOTE — Lactation Note (Signed)
This note was copied from a baby's chart. Lactation Consultation Note  Patient Name: Amy Welch Date: 04/19/2019 Reason for consult: Initial assessment;Primapara;1st time breastfeeding;NICU baby;Term  Visited with Amy Welch of a 4 hours old FT NICU female, she's a P1. Amy Welch reported (+) breast changes during the pregnancy. She participated in the [facility] program at the [facility]. LC showed her how to hand express. When assisting with hand expression, noted that her nipples are nearly flat, but her tissue is still compressible, Amy Welch not able to get colostrum yet but noticed some glistening micro droplets; she also told LC she's noticed some "leaking" while pregnant before she had to come to the hospital.   [place] set up a DEBP, instructions, cleaning and storage were reviewed as well as milk storage guidelines. Amy Welch currently on IV fluids but willing to start pumping today. Reviewed pumping schedule, pumping log and benefits of breastmilk for NICU babies. Amy Welch chooses donor milk instead of formula once her baby start feeding orally, she wanted to know what the NICU staff was going to be giving to baby until her milk comes in. Asked Amy Welch to check with baby's doctor about her wishes to do donor milk.  Feeding plan:  1. Encourage Amy Welch to pump every 3 hours, ideally 8 pumping sessions/24 hours but she'll start at her own pace 2. Once she starts getting volume, she'll start taking it to the NICU following breastmilk storage guidelines for NICU babies  BF brochure (SP), BF resources (SP) and NICU booklet were reviewed. Amy Welch reported all questions and concerns were answered, she's aware of [place] OP services and will call PRN.  Maternal Data Formula Feeding for Exclusion: No Has patient been taught Hand Expression?: Yes Does the patient have breastfeeding experience prior to this delivery?: No  Feeding    Interventions Interventions: Breast feeding basics reviewed;Breast massage;Hand express;Breast  compression;DEBP  Lactation Tools Discussed/Used Tools: Pump Breast pump type: Double-Electric Breast Pump WIC Program: Yes Pump Review: Setup, frequency, and cleaning;Milk Storage Initiated by:: MPeck Date initiated:: 04/19/19   Consult Status Consult Status: Follow-up Date: 04/20/19 Follow-up type: In-patient    [place] 04/19/2019, 1:49 PM

## 2019-04-19 NOTE — Progress Notes (Signed)
Patient ID: Amy Welch, female   DOB: July 03, 1986, 33 y.o.   MRN: [id number]  I met the patient this morning at change of shift.  The FHR is dropping in to the 60s in a late fashion. Dr. Ilda Basset attempted to place forceps during the last minutes of his shift but was unable to. Her bladder is distended as no one is able to place a Foley after multiple attempts. The fetal vertex with lots of caput is at the +1 station, the BPD at the 0 station. I placed a Kiwi vacuum with 1 push. There was no decensus. She was consented for a cesarean section and the appropriate personnel were notified. She understands the risks and benefits of surgery. A live interpretor was used for this encounter.

## 2019-04-19 NOTE — Anesthesia Postprocedure Evaluation (Signed)
Anesthesia Post Note  Patient: Amy Welch  Procedure(s) Performed: CESAREAN SECTION (N/A )     Patient location during evaluation: PACU Anesthesia Type: Epidural Level of consciousness: awake and alert and oriented Pain management: pain level controlled Vital Signs Assessment: post-procedure vital signs reviewed and stable Respiratory status: spontaneous breathing, nonlabored ventilation and respiratory function stable Cardiovascular status: blood pressure returned to baseline Postop Assessment: no apparent nausea or vomiting and epidural receding Anesthetic complications: no    Last Vitals:  Vitals:   04/19/19 1015 04/19/19 1031  BP: 105/63 109/63  Pulse: 100 96  Resp: (!) 27 (!) 27  Temp:    SpO2: 98% 97%    Last Pain:  Vitals:   04/19/19 1045  TempSrc:   PainSc: 0-No pain   Pain Goal: Patients Stated Pain Goal: 4 (04/18/19 2232)  LLE Motor Response: Purposeful movement (04/19/19 1015) LLE Sensation: Increased (04/19/19 1015) RLE Motor Response: Purposeful movement (04/19/19 1015) RLE Sensation: Increased (04/19/19 1015)        Brennan Bailey

## 2019-04-19 NOTE — Progress Notes (Signed)
LABOR PROGRESS NOTE  Amy Welch is a 33 y.o. G1P0000 at [redacted]w[redacted]d  admitted for PD IOL.  Subjective: Feeling comfortable  Objective: BP 126/70   Pulse (!) 101   Temp 98.2 F (36.8 C) (Oral)   Resp 16   Wt 86.9 kg   SpO2 97%   BMI 32.89 kg/m  or  Vitals:   04/19/19 0000 04/19/19 0015 04/19/19 0030 04/19/19 0045  BP: 122/72  126/70   Pulse: 93  (!) 101   Resp: 18  16   Temp:      TempSrc:      SpO2: 96% 98% 96% 97%  Weight:         Dilation: Lip/rim Effacement (%): 100 Cervical Position: Anterior Station: Plus 2 Presentation: Vertex Exam by:: MD Dione Plover FHT: baseline rate 150, moderate varibility, no acel, variable decel Toco: q4-5 min ctx  Labs: Lab Results  Component Value Date   WBC 8.1 04/17/2019   HGB 13.0 04/17/2019   HCT 38.5 04/17/2019   MCV 90.8 04/17/2019   PLT 182 04/17/2019    Patient Active Problem List   Diagnosis Date Noted  . Labor and delivery, indication for care 04/17/2019  . Uterine size date discrepancy pregnancy, third trimester 02/24/2019  . Telephone language interpreter service required 02/24/2019  . Encounter for supervision of other normal pregnancy, third trimester 02/08/2019  . Rubella non-immune status, antepartum 02/02/2019    Assessment / Plan: 33 y.o. G1P0000 at [redacted]w[redacted]d here for PD IOL.  Labor: s/p FB, miso x3, on pitocin since 1000. Nearly fully dilated with small rim of cervix on maternal right, easily reducible, and excellent +2 station. Will recheck in 30-45 minutes, likely start pushing at that time.  Fetal Wellbeing:  Cat II for intermittent deep variables, but overall reassuring with normal baseline and moderate variability. Does appear to be positional aspect Pain Control:  epidural Anticipated MOD:  SVD  Augustin Coupe, MD/[facility] OB Fellow  04/19/2019, 2:22 AM

## 2019-04-19 NOTE — Progress Notes (Signed)
Dr. Hulan Fray notified of patients low urine output.  Urine dark amber with only approximately 29cc per hour.  Orders received for fluid bolus and urometer bag attached.  Will monitor hourly urine output.  Will continue to assess.

## 2019-04-19 NOTE — Progress Notes (Signed)
Forceps removed per Dr Ilda Basset

## 2019-04-19 NOTE — Progress Notes (Signed)
Forceps attempted unsuccessful

## 2019-04-20 ENCOUNTER — Inpatient Hospital Stay (HOSPITAL_COMMUNITY): Payer: Medicaid Other

## 2019-04-20 ENCOUNTER — Encounter (HOSPITAL_COMMUNITY): Payer: Self-pay | Admitting: Obstetrics and Gynecology

## 2019-04-20 DIAGNOSIS — D649 Anemia, unspecified: Secondary | ICD-10-CM

## 2019-04-20 DIAGNOSIS — N179 Acute kidney failure, unspecified: Secondary | ICD-10-CM | POA: Diagnosis not present

## 2019-04-20 DIAGNOSIS — R34 Anuria and oliguria: Secondary | ICD-10-CM | POA: Diagnosis not present

## 2019-04-20 HISTORY — DX: Anemia, unspecified: D64.9

## 2019-04-20 HISTORY — DX: Acute kidney failure, unspecified: N17.9

## 2019-04-20 LAB — CBC
HCT: 27.5 % — ABNORMAL LOW (ref 36.0–46.0)
Hemoglobin: 9.2 g/dL — ABNORMAL LOW (ref 12.0–15.0)
MCH: 30.7 pg (ref 26.0–34.0)
MCHC: 33.5 g/dL (ref 30.0–36.0)
MCV: 91.7 fL (ref 80.0–100.0)
Platelets: 143 10*3/uL — ABNORMAL LOW (ref 150–400)
RBC: 3 MIL/uL — ABNORMAL LOW (ref 3.87–5.11)
RDW: 13.8 % (ref 11.5–15.5)
WBC: 20.9 10*3/uL — ABNORMAL HIGH (ref 4.0–10.5)
nRBC: 0 % (ref 0.0–0.2)

## 2019-04-20 LAB — BASIC METABOLIC PANEL
Anion gap: 7 (ref 5–15)
BUN: 19 mg/dL (ref 6–20)
CO2: 23 mmol/L (ref 22–32)
Calcium: 7.6 mg/dL — ABNORMAL LOW (ref 8.9–10.3)
Chloride: 107 mmol/L (ref 98–111)
Creatinine, Ser: 1.39 mg/dL — ABNORMAL HIGH (ref 0.44–1.00)
GFR calc Af Amer: 58 mL/min — ABNORMAL LOW (ref >60)
GFR calc non Af Amer: 50 mL/min — ABNORMAL LOW (ref >60)
Glucose, Bld: 76 mg/dL (ref 70–99)
Potassium: 3.4 mmol/L — ABNORMAL LOW (ref 3.5–5.1)
Sodium: 137 mmol/L (ref 135–145)

## 2019-04-20 MED ORDER — PHENYLEPHRINE HCL-NACL 20-0.9 MG/250ML-% IV SOLN
INTRAVENOUS | Status: AC
  Filled 2019-04-20: qty 250

## 2019-04-20 MED ORDER — POTASSIUM CHLORIDE CRYS ER 20 MEQ PO TBCR
30.0000 meq | EXTENDED_RELEASE_TABLET | Freq: Two times a day (BID) | ORAL | Status: AC
  Administered 2019-04-20 – 2019-04-22 (×4): 30 meq via ORAL
  Filled 2019-04-20 (×4): qty 2

## 2019-04-20 MED ORDER — SENNOSIDES-DOCUSATE SODIUM 8.6-50 MG PO TABS: 2.0000 | ORAL_TABLET | Freq: Every evening | ORAL | Status: DC | PRN

## 2019-04-20 MED ORDER — POLYETHYLENE GLYCOL 3350 17 G PO PACK
17.0000 g | PACK | Freq: Two times a day (BID) | ORAL | Status: DC
  Administered 2019-04-21 – 2019-04-22 (×2): 17 g via ORAL
  Filled 2019-04-20 (×4): qty 1

## 2019-04-20 MED ORDER — LACTATED RINGERS IV BOLUS
300.0000 mL | Freq: Once | INTRAVENOUS | Status: AC
  Administered 2019-04-20: 300 mL via INTRAVENOUS

## 2019-04-20 MED ORDER — FERROUS GLUCONATE 324 (38 FE) MG PO TABS
324.0000 mg | ORAL_TABLET | Freq: Every day | ORAL | Status: DC
  Administered 2019-04-21 – 2019-04-22 (×2): 324 mg via ORAL
  Filled 2019-04-20 (×2): qty 1

## 2019-04-20 NOTE — Progress Notes (Addendum)
OB Update Note SOB improved, still meeting all PO/PP goals, including has voided and patient feeling well.   Interpreter used.   Durene Romans MD Attending Center for [employer] (Faculty Practice) 04/20/2019 Time: 2039

## 2019-04-20 NOTE — Progress Notes (Addendum)
Daily Postpartum Note  Admission Date: 04/17/2019 Current Date: 04/20/2019 11:27 AM  Amy Welch is a 33 y.o. G1P1001 POD#1 s/p urgent pLTCS for arrest of descent and NRFHT (EBL 1418m), admitted for post dates IOL .  Pregnancy complicated by: Patient Active Problem List   Diagnosis Date Noted  . Postoperative anemia 04/20/2019  . Decreased urine output 04/20/2019  . AKI (acute kidney injury) ([place] 04/20/2019  . Labor and delivery, indication for care 04/17/2019  . Uterine size date discrepancy pregnancy, third trimester 02/24/2019  . Telephone language interpreter service required 02/24/2019  . Encounter for supervision of other normal pregnancy, third trimester 02/08/2019  . Rubella non-immune status, antepartum 02/02/2019    Overnight/24hr events:  Decreased UOP and elevated Cr. Urology curbsided o/n by night team and CT done and foley cath placed.   Subjective:  Taking PO fine, pain controlled with PO meds, +ambulation, normal lochia, some flatus; no nausea, vomiting or back pain.   Objective:    Current Vital Signs 24h Vital Sign Ranges  T (!) 97.5 F (36.4 C) Temp  Avg: 98.2 F (36.8 C)  Min: 97.5 F (36.4 C)  Max: 98.8 F (37.1 C)  BP 93/63 BP  Min: 93/63  Max: 128/77  HR 72 Pulse  Avg: 80.6  Min: 70  Max: 90  RR (!) 21 Resp  Avg: 20.2  Min: 18  Max: 22  SaO2 96 % Room Air SpO2  Avg: 94.5 %  Min: 88 %  Max: 97 %       24 Hour I/O Current Shift I/O  Time Ins Outs 09/06 0701 - 09/07 0700 In: 4305.5 [P.O.:360; I.V.:3945.5] Out: 3261 [Urine:1856] 09/07 0701 - 09/07 1900 In: -  Out: 100 [Urine:100]   Patient Vitals for the past 24 hrs:  BP Temp Temp src Pulse Resp SpO2 Height Weight  04/20/19 0746 93/63 (!) 97.5 F (36.4 C) Oral 72 (!) 21 96 % - -  04/20/19 0745 - - - - - 97 % - -  04/20/19 0740 - - - - - 96 % - -  04/20/19 0735 - - - - - 97 % - -  04/20/19 0730 - - - - - 96 % - -  04/20/19 0725 - - - - - 97 % - -  04/20/19 0720 - - - - - 97 % - -   04/20/19 0715 - - - - - 96 % - -  04/20/19 0710 - - - - - 96 % - -  04/20/19 0705 - - - - - 97 % - -  04/20/19 0700 - - - - - 96 % - -  04/20/19 0655 - - - - - 97 % - -  04/20/19 0650 - - - - - 94 % - -  04/20/19 0645 - - - - - 96 % - -  04/20/19 0640 - - - - - 95 % - -  04/20/19 0635 - - - - - 95 % - -  04/20/19 0630 - - - - - 96 % - -  04/20/19 0625 - - - - - 95 % - -  04/20/19 0620 - - - - - 95 % - -  04/20/19 0615 - - - - - 95 % - -  04/20/19 0610 - - - - - 95 % - -  04/20/19 0605 - - - - - 95 % - -  04/20/19 0600 - - - - - 94 % - -  04/20/19 0555 - - - - - 95 % - -  04/20/19 0550 - - - - - 95 % - -  04/20/19 0545 - - - - - 96 % - -  04/20/19 0540 - - - - - 95 % - -  04/20/19 0535 - - - - - 95 % - -  04/20/19 0530 - - - - - 94 % - -  04/20/19 0525 - - - - - 94 % - -  04/20/19 0520 - - - - - 94 % - -  04/20/19 0515 - - - - - 94 % - -  04/20/19 0510 - - - - - 93 % - -  04/20/19 0505 - - - - - 94 % - -  04/20/19 0500 - - - - - 93 % - -  04/20/19 0455 - - - - - 94 % - -  04/20/19 0450 - - - - - 95 % - -  04/20/19 0449 107/67 98 F (36.7 C) Oral 77 (!) 22 96 % - -  04/20/19 0430 - - - - - 95 % - -  04/20/19 0425 - - - - - 95 % - -  04/20/19 0424 (!) 100/51 - - 70 - - - -  04/20/19 0420 - - - - - 95 % - -  04/20/19 0415 - - - - - 95 % - -  04/20/19 0410 - - - - - 94 % - -  04/20/19 0405 - - - - - 94 % - -  04/20/19 0400 - - - - - 94 % - -  04/20/19 0355 - - - - - 94 % - -  04/20/19 0350 - - - - - 94 % - -  04/20/19 0345 - - - - - 94 % - -  04/20/19 0340 - - - - - 94 % - -  04/20/19 0335 - - - - - 94 % - -  04/20/19 0330 - - - - - 94 % - -  04/20/19 0325 - - - - - 94 % - -  04/20/19 0320 - - - - - 95 % - -  04/20/19 0315 - - - - - 94 % - -  04/20/19 0310 - - - - - 95 % - -  04/20/19 0305 - - - - - 95 % - -  04/20/19 0300 - - - - - 95 % - -  04/20/19 0255 - - - - - 95 % - -  04/20/19 0250 - - - - - 94 % - -  04/20/19 0245 - - - - - (!) 88 % - -  04/20/19 0240 - -  - - - 93 % - -  04/20/19 0235 - - - - - 94 % - -  04/20/19 0230 - - - - - 94 % - -  04/20/19 0225 - - - - - 95 % - -  04/20/19 0220 - - - - - 94 % - -  04/20/19 0215 - - - - - 94 % - -  04/20/19 0210 - - - - - 94 % - -  04/20/19 0205 - - - - - 93 % - -  04/20/19 0200 - - - - - 94 % - -  04/20/19 0155 - - - - - 94 % - -  04/20/19 0150 - - - - - 93 % - -  04/20/19 0145 - - - - -  93 % - -  04/20/19 0140 - - - - - 93 % - -  04/20/19 0135 - - - - - 93 % - -  04/20/19 0130 - - - - - 93 % - -  04/20/19 0125 - - - - - 93 % - -  04/20/19 0120 - - - - - 94 % - -  04/20/19 0115 - - - - - 93 % - -  04/20/19 0110 - - - - - 94 % - -  04/20/19 0105 - - - - - 93 % - -  04/20/19 0100 - - - - - 93 % - -  04/20/19 0025 - - - - - 93 % - -  04/20/19 0010 - - - - - 94 % - -  04/19/19 2358 107/63 98 F (36.7 C) Oral 79 18 93 % - -  04/19/19 2255 - - - - - 95 % - -  04/19/19 2240 - - - - - 93 % - -  04/19/19 2220 - - - - - 93 % - -  04/19/19 2200 100/60 98.2 F (36.8 C) Oral 82 20 97 % - -  04/19/19 2100 - - - - - 95 % - -  04/19/19 2000 - - - - - 96 % - -  04/19/19 1945 108/67 98 F (36.7 C) - 83 (!) 22 96 % - -  04/19/19 1900 - - - - - 96 % - -  04/19/19 1750 - - - - - 94 % - -  04/19/19 1600 - - - - - 90 % - -  04/19/19 1559 120/71 98.5 F (36.9 C) Oral 90 20 91 % - -  04/19/19 1400 - - - - - 93 % - -  04/19/19 1359 116/76 98.8 F (37.1 C) Oral 89 19 93 % - -  04/19/19 1327 - - - - - - '5\' 5"'  (1.651 m) 82.6 kg  04/19/19 1301 128/77 98.3 F (36.8 C) Oral 75 20 95 % - -  04/19/19 1201 121/61 98.4 F (36.9 C) Oral 89 20 94 % - -  04/19/19 1155 - - - - - 94 % - -  04/19/19 1150 - - - - - 95 % - -  04/19/19 1145 - - - - - 95 % - -  04/19/19 1140 - - - - - 96 % - -  04/19/19 1135 - - - - - 96 % - -  04/19/19 1130 - - - - - 96 % - -   UOP: approximately 50-50m/hr  Physical exam: General: Well nourished, well developed female in no acute distress. Abdomen: rare BS, soft, nttp, c/d/i  dressing. Mild to moderate distension Cardiovascular: S1, S2 normal, no murmur, rub or gallop, regular rate and rhythm Respiratory: CTAB Extremities: no clubbing, cyanosis or edema Skin: Warm and dry.   Medications: Current Facility-Administered Medications  Medication Dose Route Frequency Provider Last Rate Last Dose  . acetaminophen (TYLENOL) tablet 1,000 mg  1,000 mg Oral Q6H Sparacino, Hailey L, DO   1,000 mg at 04/20/19 0549  . coconut oil  1 application Topical PRN Sparacino, Hailey L, DO   1 application at 010/27/251824  . witch hazel-glycerin (TUCKS) pad 1 application  1 application Topical PRN Sparacino, Hailey L, DO       And  . dibucaine (NUPERCAINAL) 1 % rectal ointment 1 application  1 application Rectal PRN Sparacino, Hailey L, DO      .  diphenhydrAMINE (BENADRYL) injection 12.5 mg  12.5 mg Intravenous Q4H PRN Brennan Bailey, MD       Or  . diphenhydrAMINE (BENADRYL) capsule 25 mg  25 mg Oral Q4H PRN Brennan Bailey, MD      . diphenhydrAMINE (BENADRYL) capsule 25 mg  25 mg Oral Q6H PRN Sparacino, Hailey L, DO      . diphenhydrAMINE (BENADRYL) injection 25 mg  25 mg Intravenous Once Clarnce Flock, MD      . enoxaparin (LOVENOX) injection 40 mg  40 mg Subcutaneous Q24H Sparacino, Hailey L, DO   40 mg at 04/20/19 0937  . ibuprofen (ADVIL) tablet 800 mg  800 mg Oral TID PRN Sparacino, Hailey L, DO      . ketorolac (TORADOL) 30 MG/ML injection 30 mg  30 mg Intravenous Q6H Sparacino, Hailey L, DO   30 mg at 04/20/19 0549  . lactated ringers infusion   Intravenous Continuous Sparacino, Hailey L, DO 125 mL/hr at 04/20/19 0550    . measles, mumps & rubella vaccine (MMR) injection 0.5 mL  0.5 mL Subcutaneous Once Sparacino, Hailey L, DO      . menthol-cetylpyridinium (CEPACOL) lozenge 3 mg  1 lozenge Oral Q2H PRN Sparacino, Hailey L, DO      . nalbuphine (NUBAIN) injection 5 mg  5 mg Intravenous Q4H PRN Brennan Bailey, MD       Or  . nalbuphine (NUBAIN) injection 5 mg  5 mg  Subcutaneous Q4H PRN Brennan Bailey, MD      . nalbuphine (NUBAIN) injection 5 mg  5 mg Intravenous Once PRN Brennan Bailey, MD       Or  . nalbuphine (NUBAIN) injection 5 mg  5 mg Subcutaneous Once PRN Brennan Bailey, MD      . naloxone [facility]) injection 0.4 mg  0.4 mg Intravenous PRN Brennan Bailey, MD       And  . sodium chloride flush (NS) 0.9 % injection 3 mL  3 mL Intravenous PRN Brennan Bailey, MD      . naloxone HCl (NARCAN) 2 mg in dextrose 5 % 250 mL infusion  1-4 mcg/kg/hr Intravenous Continuous PRN Brennan Bailey, MD      . ondansetron (ZOFRAN) injection 4 mg  4 mg Intravenous Q8H PRN Brennan Bailey, MD      . oxyCODONE (Oxy IR/ROXICODONE) immediate release tablet 5-10 mg  5-10 mg Oral Q4H PRN Sparacino, Hailey L, DO      . prenatal multivitamin tablet 1 tablet  1 tablet Oral Q1200 Sparacino, Hailey L, DO      . senna-docusate (Senokot-S) tablet 2 tablet  2 tablet Oral Q24H Sparacino, Hailey L, DO   2 tablet at 04/20/19 0005  . simethicone (MYLICON) chewable tablet 80 mg  80 mg Oral TID PC Sparacino, Hailey L, DO   80 mg at 04/20/19 0935  . simethicone (MYLICON) chewable tablet 80 mg  80 mg Oral Q24H Sparacino, Hailey L, DO   80 mg at 04/20/19 0005  . simethicone (MYLICON) chewable tablet 80 mg  80 mg Oral PRN Sparacino, Hailey L, DO      . Tdap (BOOSTRIX) injection 0.5 mL  0.5 mL Intramuscular Once Sparacino, Hailey L, DO      . zolpidem (AMBIEN) tablet 5 mg  5 mg Oral QHS PRN Sparacino, Hailey L, DO        Labs:  Recent Labs  Lab 04/19/19 1234 04/19/19 1856 04/20/19 0548  WBC  20.5* 26.4* 20.9*  HGB 11.0* 10.5* 9.2*  HCT 32.7* 30.6* 27.5*  PLT 151 142* 143*    Recent Labs  Lab 04/19/19 1234 04/19/19 1856 04/20/19 0928  NA  --  132* 137  K  --  4.1 3.4*  CL  --  103 107  CO2  --  19* 23  BUN  --  16 19  CREATININE 1.81* 1.60* 1.39*  CALCIUM  --  7.6* 7.6*  PROT  --  4.2*  --   BILITOT  --  0.6  --   ALKPHOS  --  105  --   ALT  --  15  --   AST   --  41  --   GLUCOSE  --  163* 76     Radiology:  CLINICAL DATA:  Acute renal injury. C-section today.  EXAM: CT ABDOMEN AND PELVIS WITHOUT CONTRAST  TECHNIQUE: Multidetector CT imaging of the abdomen and pelvis was performed following the standard protocol without IV contrast.  COMPARISON:  None.  FINDINGS: Lower chest: Small bilateral pleural effusions. Mild-to-moderate bilateral dependent atelectasis. Prominent pulmonary vasculature. Mildly enlarged heart.  Hepatobiliary: Dilated gallbladder. No gallbladder wall thickening or pericholecystic fluid. No visible gallstones. Normal appearing liver.  Pancreas: Unremarkable. No pancreatic ductal dilatation or surrounding inflammatory changes.  Spleen: Normal in size without focal abnormality.  Adrenals/Urinary Tract: Normal appearing adrenal glands. Mild dilatation of both renal collecting systems and ureters to the level of a markedly enlarged uterus. Contrast injected through the patient's Foley catheter in the urinary bladder with an associated small amount of air in the bladder. The bladder has a normal appearance with no filling defects seen.  Stomach/Bowel: Redundant colon containing stool and gas. The sigmoid colon is compressed by the enlarged uterus. Unremarkable stomach and small bowel. No gross evidence of appendicitis.  Vascular/Lymphatic: No significant vascular findings are present. No enlarged abdominal or pelvic lymph nodes.  Reproductive: Markedly enlarged uterus containing some fluid, blood and gas. No visible adnexal mass.  Other: Multiple small locules of free peritoneal air as well as air in the C-section incision in the anterior subcutaneous fat. Small amount of free peritoneal fluid.  Musculoskeletal: Minimal lumbar and lower thoracic spine degenerative changes.  IMPRESSION: 1. Markedly enlarged uterus containing some fluid, blood and gas. 2. Mild bilateral hydronephrosis and  hydroureter to the level of the markedly enlarged uterus, compatible with compression of the ureters by the uterus. 3. Impression of the sigmoid colon by the enlarged uterus without obstruction. 4. Contrast injected through the patient's Foley catheter in the urinary bladder with an associated small amount of air in the bladder with no visible blood clots or other abnormalities. 5. Small bilateral pleural effusions. 6. Mild-to-moderate bilateral dependent atelectasis. 7. Cardiomegaly and pulmonary vascular congestion. 8. Small amount of free peritoneal fluid and postoperative air.   Electronically Signed   By: Claudie Revering M.D.   On: 04/19/2019 22:13  Assessment & Plan:  Pt stable *PP/post op: routine care. O pos. Breast. Infant doing okay in the NICU.  *Renal: continue foley for now. Likely d/c tomorrow. BMP improving. Rpt tomorrow. D/c motrin  *Anemia: no s/s. Rpt cbc tomorrow. Start iron *PPx: lovenox, oob ad lib *FEN/GI: d/c IVF, pt told to take a lot of PO liquids *Dispo: likely pod#3  Interpreter used  Durene Romans. MD Attending Center for [employer] Fish farm manager)

## 2019-04-21 LAB — BASIC METABOLIC PANEL
Anion gap: 10 (ref 5–15)
BUN: 17 mg/dL (ref 6–20)
CO2: 22 mmol/L (ref 22–32)
Calcium: 8.1 mg/dL — ABNORMAL LOW (ref 8.9–10.3)
Chloride: 107 mmol/L (ref 98–111)
Creatinine, Ser: 1.03 mg/dL — ABNORMAL HIGH (ref 0.44–1.00)
GFR calc Af Amer: >60 mL/min (ref >60)
GFR calc non Af Amer: >60 mL/min (ref >60)
Glucose, Bld: 71 mg/dL (ref 70–99)
Potassium: 3.7 mmol/L (ref 3.5–5.1)
Sodium: 139 mmol/L (ref 135–145)

## 2019-04-21 LAB — CBC
HCT: 30 % — ABNORMAL LOW (ref 36.0–46.0)
Hemoglobin: 10 g/dL — ABNORMAL LOW (ref 12.0–15.0)
MCH: 30.5 pg (ref 26.0–34.0)
MCHC: 33.3 g/dL (ref 30.0–36.0)
MCV: 91.5 fL (ref 80.0–100.0)
Platelets: 171 10*3/uL (ref 150–400)
RBC: 3.28 MIL/uL — ABNORMAL LOW (ref 3.87–5.11)
RDW: 13.6 % (ref 11.5–15.5)
WBC: 18.8 10*3/uL — ABNORMAL HIGH (ref 4.0–10.5)
nRBC: 0 % (ref 0.0–0.2)

## 2019-04-21 NOTE — Lactation Note (Signed)
This note was copied from a baby's chart. Lactation Consultation Note  Patient Name: Amy Welch [id number] Date: 04/21/2019 Reason for consult: Follow-up assessment  LC Follow Up Visit:  Attempted to visit with mother, however, she was in the shower.  I will return later today.   Maternal Data    Feeding Feeding Type: Formula Nipple Type: Dr. Roosvelt Harps Preemie(wide; switched from purple)  LATCH Score                   Interventions    Lactation Tools Discussed/Used     Consult Status Consult Status: Follow-up Date: 04/21/19 Follow-up type: In-patient    [name] R [name] 04/21/2019, 10:07 AM

## 2019-04-21 NOTE — Progress Notes (Signed)
Daily Postpartum Note  Admission Date: 04/17/2019 Current Date: 04/21/2019 7:30 AM  Amy Welch is a 33 y.o. G1P1001 POD#2 s/p urgent pLTCS for arrest of descent and NRFHT (EBL 1400mL), admitted for post dates IOL .  Pregnancy complicated by: Patient Active Problem List   Diagnosis Date Noted  . Postoperative anemia 04/20/2019  . Decreased urine output 04/20/2019  . AKI (acute kidney injury) (HCC) 04/20/2019  . Labor and delivery, indication for care 04/17/2019  . Uterine size date discrepancy pregnancy, third trimester 02/24/2019  . Telephone language interpreter service required 02/24/2019  . Encounter for supervision of other normal pregnancy, third trimester 02/08/2019  . Rubella non-immune status, antepartum 02/02/2019    Overnight/24hr events:  None  Subjective:  Meeting all post op goals, including voiding and flatus.   Objective:    Current Vital Signs 24h Vital Sign Ranges  T 97.7 F (36.5 C) Temp  Avg: 97.9 F (36.6 C)  Min: 97.5 F (36.4 C)  Max: 98.8 F (37.1 C)  BP 116/70 BP  Min: 93/63  Max: 134/76  HR 73 Pulse  Avg: 76.3  Min: 72  Max: 84  RR 18 Resp  Avg: 19.5  Min: 18  Max: 22  SaO2 99 % Room Air SpO2  Avg: 96.3 %  Min: 94 %  Max: 99 %       24 Hour I/O Current Shift I/O  Time Ins Outs 09/07 0701 - 09/08 0700 In: 955 [P.O.:420; I.V.:535] Out: 2325 [Urine:2325] No intake/output data recorded.   Patient Vitals for the past 24 hrs:  BP Temp Temp src Pulse Resp SpO2  04/21/19 0611 116/70 97.7 F (36.5 C) Oral 73 18 99 %  04/20/19 2345 134/76 98 F (36.7 C) Oral 84 18 98 %  04/20/19 1934 123/81 97.9 F (36.6 C) Oral 75 18 95 %  04/20/19 1605 - - - - - 98 %  04/20/19 1600 - - - - - 97 %  04/20/19 1555 - - - - - 98 %  04/20/19 1550 - - - - - 97 %  04/20/19 1546 112/67 98.8 F (37.1 C) Oral 75 20 97 %  04/20/19 1410 - - - - - 97 %  04/20/19 1405 - - - - - 97 %  04/20/19 1400 - - - - - 97 %  04/20/19 1355 - - - - - 97 %  04/20/19 1350 - -  - - - 96 %  04/20/19 1345 - - - - - 97 %  04/20/19 1340 - - - - - 96 %  04/20/19 1335 - - - - - 96 %  04/20/19 1330 - - - - - 96 %  04/20/19 1325 - - - - - 96 %  04/20/19 1320 - - - - - 94 %  04/20/19 1315 - - - - - 95 %  04/20/19 1310 - - - - - 96 %  04/20/19 1305 - - - - - 96 %  04/20/19 1300 - - - - - 95 %  04/20/19 1255 - - - - - 95 %  04/20/19 1250 - - - - - 96 %  04/20/19 1245 - - - - - 96 %  04/20/19 1240 - - - - - 95 %  04/20/19 1148 105/70 97.6 F (36.4 C) Oral 79 (!) 22 94 %  04/20/19 0746 93/63 (!) 97.5 F (36.4 C) Oral 72 (!) 21 96 %  04/20/19 0745 - - - - - 97 %  04/20/19 0740 - - - - - 96 %  04/20/19 0735 - - - - - 97 %   UOP: >134mL/hr  Physical exam: General: Well nourished, well developed female in no acute distress. Abdomen: rare BS, soft, nttp, c/d/i dressing. Mild to moderate distension Cardiovascular: S1, S2 normal, no murmur, rub or gallop, regular rate and rhythm Respiratory: CTAB Extremities: no clubbing, cyanosis or edema Skin: Warm and dry.   Medications: Current Facility-Administered Medications  Medication Dose Route Frequency Provider Last Rate Last Dose  . acetaminophen (TYLENOL) tablet 1,000 mg  1,000 mg Oral Q6H Sparacino, Hailey L, DO   1,000 mg at 04/21/19 0608  . coconut oil  1 application Topical PRN Sparacino, Hailey L, DO   1 application at 04/19/19 1824  . witch hazel-glycerin (TUCKS) pad 1 application  1 application Topical PRN Sparacino, Hailey L, DO       And  . dibucaine (NUPERCAINAL) 1 % rectal ointment 1 application  1 application Rectal PRN Sparacino, Hailey L, DO      . diphenhydrAMINE (BENADRYL) injection 12.5 mg  12.5 mg Intravenous Q4H PRN Kaylyn Layer, MD       Or  . diphenhydrAMINE (BENADRYL) capsule 25 mg  25 mg Oral Q4H PRN Kaylyn Layer, MD      . diphenhydrAMINE (BENADRYL) capsule 25 mg  25 mg Oral Q6H PRN Sparacino, Hailey L, DO      . diphenhydrAMINE (BENADRYL) injection 25 mg  25 mg Intravenous Once  Venora Maples, MD      . enoxaparin (LOVENOX) injection 40 mg  40 mg Subcutaneous Q24H Sparacino, Hailey L, DO   40 mg at 04/20/19 0937  . ferrous gluconate (FERGON) tablet 324 mg  324 mg Oral Q breakfast Earl Bing, MD      . menthol-cetylpyridinium (CEPACOL) lozenge 3 mg  1 lozenge Oral Q2H PRN Sparacino, Hailey L, DO      . nalbuphine (NUBAIN) injection 5 mg  5 mg Intravenous Q4H PRN Kaylyn Layer, MD       Or  . nalbuphine (NUBAIN) injection 5 mg  5 mg Subcutaneous Q4H PRN Kaylyn Layer, MD      . nalbuphine (NUBAIN) injection 5 mg  5 mg Intravenous Once PRN Kaylyn Layer, MD       Or  . nalbuphine (NUBAIN) injection 5 mg  5 mg Subcutaneous Once PRN Kaylyn Layer, MD      . naloxone [facility]) injection 0.4 mg  0.4 mg Intravenous PRN Kaylyn Layer, MD       And  . sodium chloride flush (NS) 0.9 % injection 3 mL  3 mL Intravenous PRN Kaylyn Layer, MD      . naloxone HCl (NARCAN) 2 mg in dextrose 5 % 250 mL infusion  1-4 mcg/kg/hr Intravenous Continuous PRN Kaylyn Layer, MD      . ondansetron [facility]) injection 4 mg  4 mg Intravenous Q8H PRN Kaylyn Layer, MD      . oxyCODONE (Oxy IR/ROXICODONE) immediate release tablet 5-10 mg  5-10 mg Oral Q4H PRN Sparacino, Hailey L, DO   5 mg at 04/20/19 1755  . polyethylene glycol (MIRALAX / GLYCOLAX) packet 17 g  17 g Oral BID AC Querida Beretta, MD      . potassium chloride SA (K-DUR) CR tablet 30 mEq  30 mEq Oral BID [place] Bing, MD   30 mEq at 04/20/19 1822  . prenatal multivitamin tablet 1 tablet  1  tablet Oral Q1200 Sparacino, Hailey L, DO   1 tablet at 04/20/19 1200  . senna-docusate (Senokot-S) tablet 2 tablet  2 tablet Oral QHS PRN Aletha Halim, MD      . simethicone (MYLICON) chewable tablet 80 mg  80 mg Oral TID PC Sparacino, Hailey L, DO   80 mg at 04/20/19 1755  . simethicone (MYLICON) chewable tablet 80 mg  80 mg Oral Q24H Sparacino, Hailey L, DO   80 mg at 04/21/19 0040  . Tdap (BOOSTRIX)  injection 0.5 mL  0.5 mL Intramuscular Once Sparacino, Hailey L, DO      . zolpidem (AMBIEN) tablet 5 mg  5 mg Oral QHS PRN Sparacino, Hailey L, DO        Labs:  Recent Labs  Lab 04/19/19 1856 04/20/19 0548 04/21/19 0509  WBC 26.4* 20.9* 18.8*  HGB 10.5* 9.2* 10.0*  HCT 30.6* 27.5* 30.0*  PLT 142* 143* 171    Recent Labs  Lab 04/19/19 1856 04/20/19 0928 04/21/19 0509  NA 132* 137 139  K 4.1 3.4* 3.7  CL 103 107 107  CO2 19* 23 22  BUN 16 19 17   CREATININE 1.60* 1.39* 1.03*  CALCIUM 7.6* 7.6* 8.1*  PROT 4.2*  --   --   BILITOT 0.6  --   --   ALKPHOS 105  --   --   ALT 15  --   --   AST 41  --   --   GLUCOSE 163* 76 71     Radiology: no new imaging  Assessment & Plan:  Pt stable *PP/post op: routine care. O pos. Breast. Infant doing okay in the NICU.  *Renal: improving Cr.  *Anemia: no s/s.  *PPx: lovenox, oob ad lib *FEN/GI: SLIV, regular diet.  *Dispo: likely pod#3  Interpreter used  Durene Romans. MD Attending [facility] for [employer] farm manager)

## 2019-04-21 NOTE — Lactation Note (Signed)
This note was copied from a baby's chart. Lactation Consultation Note  Patient Name: Amy Welch NOMVE'H Date: 04/21/2019 Reason for consult: Follow-up assessment;Term;NICU baby  P1 mother whose infant is now 20 hours old.  This is a term baby in the NICU.  In house interpreter, Amy Welch, used for interpretation.  Mother has been pumping using the DEBP every three hours.  She had no questions/concerns related to pumping.  She knows to bring all EBM to the NICU for her baby.  [facility] referral sent for DEBP after discharge.  Mother had no support person with her during our visit.  Encouraged her to continue doing hand expression before/after pumping to help increase milk supply.  Mother verbalized understanding.     Maternal Data Formula Feeding for Exclusion: No Has patient been taught Hand Expression?: Yes Does the patient have breastfeeding experience prior to this delivery?: No  Feeding Feeding Type: Formula Nipple Type: Dr. Roosvelt Harps Preemie(wide base)  LATCH Score                   Interventions    Lactation Tools Discussed/Used WIC Program: Yes Pump Review: (Mother had no questions)   Consult Status Consult Status: Follow-up Date: 04/22/19 Follow-up type: In-patient    James Lafalce R Ecko Beasley 04/21/2019, 3:08 PM

## 2019-04-22 ENCOUNTER — Ambulatory Visit: Payer: Self-pay

## 2019-04-22 DIAGNOSIS — O480 Post-term pregnancy: Secondary | ICD-10-CM | POA: Diagnosis present

## 2019-04-22 LAB — BASIC METABOLIC PANEL
Anion gap: 8 (ref 5–15)
BUN: 14 mg/dL (ref 6–20)
CO2: 21 mmol/L — ABNORMAL LOW (ref 22–32)
Calcium: 8 mg/dL — ABNORMAL LOW (ref 8.9–10.3)
Chloride: 110 mmol/L (ref 98–111)
Creatinine, Ser: 0.76 mg/dL (ref 0.44–1.00)
GFR calc Af Amer: >60 mL/min (ref >60)
GFR calc non Af Amer: >60 mL/min (ref >60)
Glucose, Bld: 89 mg/dL (ref 70–99)
Potassium: 4.2 mmol/L (ref 3.5–5.1)
Sodium: 139 mmol/L (ref 135–145)

## 2019-04-22 MED ORDER — INFLUENZA VAC SPLIT QUAD 0.5 ML IM SUSY
0.5000 mL | PREFILLED_SYRINGE | Freq: Once | INTRAMUSCULAR | Status: AC
  Administered 2019-04-22: 13:00:00 0.5 mL via INTRAMUSCULAR
  Filled 2019-04-22: qty 0.5

## 2019-04-22 MED ORDER — IBUPROFEN 600 MG PO TABS: 600.0000 mg | ORAL_TABLET | Freq: Three times a day (TID) | ORAL | 0 refills | Status: DC | PRN

## 2019-04-22 MED ORDER — OXYCODONE HCL 5 MG PO TABS: 5.0000 mg | ORAL_TABLET | ORAL | 0 refills | Status: DC | PRN

## 2019-04-22 MED FILL — IBUPROFEN 600 MG TABLET: 600 | 6 days supply | Qty: 20 | Fill #0

## 2019-04-22 MED FILL — oxyCODONE HCL 5 MG TABS: 5 | 1 days supply | Qty: 20 | Fill #0

## 2019-04-22 NOTE — Lactation Note (Signed)
This note was copied from a baby's chart. Lactation Consultation Note  Patient Name: Amy Welch WVPXT'G Date: 04/22/2019 Reason for consult: Difficult latch;Other (Comment);Follow-up assessment(request from RN and feeding specialist for NS for 5pm feed)    Speech and Language pathologist suggested trying NS with next feed.  RN called LC to meet with mom for 5pm feed.  [place] interpreter used due to [place] interpreter being with patient discharge.  [place] worked with mom to hand express.  A few drops seen but infant was cueing.  Infant was placed STS with mom.  NS 24 placed on nipple and mom was taught how to apply NS.  It was prefilled with similac formula.  Infant placed in football hold and opened wide to latch.  Infant began rhythmic suck pattern and deep jaw movements noted almost immediately.  LC broke infant latch to view NS; it was empty, no formula remained.    LC then filled 28ml purple syringe and primed feeding tube which was then placed under NS.  Infant latched again and began sucking, cheeks close to breasts and mom felt no discomfort.  Gentle pressure applied to syringe and infant swallows heard and no distress noted.  Biagio Borg would take pauses and LC explained to just hold syringe and not to apply pressure to syringe until infant began sucking.  Infant took 58ml  then fell asleep at breast.  He was burped then mom placed NS on other side and infant woke to feed the last 10 ml.  RN witnessed the feed and expressed how well this feed was going compared to previous attempts, bottle included.    [place] praised mom and baby.  Mom was very excited he breastfed well.  [place] asked mom about her bf goals.  Mom desires to bf.  She pumped yesterday, was DC from hospital today, and will pick up [facility] pump tomorrow at [place] reviewed DEBP, set up for mom.  Mom denies questions about pumping.  LC encouraged her to pump at least 8 times in 24 hours.  Recommended pumping every 2-3 hours during  the day and taking a 4 hour break at night between pumps if needed.    LC reviewed with mom the plan to feed:  Hand express.  Apply NS.  Used Curved tip syringe to prefill if needed.  Slip 5 fr. Feeding tube under shield prior to latching.  Gently apply pressure a little at a time watching infant carefully to observe for any signs of distress or disinterest in suck or cessation of suck and to not continue with feed if any of these are present.       Maternal Data Has patient been taught Hand Expression?: Yes Does the patient have breastfeeding experience prior to this delivery?: No  Feeding Feeding Type: Breast Fed  LATCH Score Latch: Grasps breast easily, tongue down, lips flanged, rhythmical sucking.  Audible Swallowing: Spontaneous and intermittent  Type of Nipple: Flat(short shafted nipple; semi flat)  Comfort (Breast/Nipple): Soft / non-tender  Hold (Positioning): Assistance needed to correctly position infant at breast and maintain latch.  LATCH Score: 8  Interventions Interventions: Breast feeding basics reviewed;Assisted with latch;Support pillows;Position options;Skin to skin;Expressed milk;Breast massage;Hand express;Adjust position;DEBP  Lactation Tools Discussed/Used Tools: Nipple [place] Fuel;Supplemental Nutrition System Nipple shield size: 24 Breast pump type: Double-Electric Breast Pump WIC Program: Yes Pump Review: Setup, frequency, and cleaning;Milk Storage   Consult Status Consult Status: Follow-up Date: 04/23/19 Follow-up type: In-patient    Ferne Coe [facility] 04/22/2019,  6:41 PM

## 2019-04-22 NOTE — Discharge Instructions (Signed)
Parto por cesrea, cuidados posteriores  Cesarean Delivery, Care After  Esta hoja le brinda informacin sobre cmo cuidarse despus del procedimiento. El mdico tambin podr darle indicaciones ms especficas. Comunquese con el mdico si tiene problemas o preguntas.  Qu puedo esperar despus del procedimiento?  Despus del procedimiento, es comn tener los siguientes sntomas:   Una pequea cantidad de sangre o de lquido transparente que proviene de la incisin.   Enrojecimiento, hinchazn y dolor en la zona de la incisin.   Dolor y molestia abdominales.   Hemorragia vaginal (loquios). Aunque no haya tenido parto vaginal, tendr sangrado y secrecin vaginal.   Calambres plvicos.   Fatiga.  Es posible que sienta dolor, hinchazn y molestias en el tejido que se encuentra entre la vagina y el ano (perineo) en los siguientes casos:   Si la cesrea no fue planificada y le permitieron realizar el trabajo de parto y pujar.   Si le hicieron una incisin en la zona (episiotoma) o el tejido se desgarr durante el intento de parto vaginal.  Siga estas indicaciones en su casa:  Cuidados de la incisin     Siga las indicaciones del mdico acerca del cuidado de la incisin. Asegrese de hacer lo siguiente:  ? Lvese las manos con agua y jabn antes de cambiar las vendas (vendaje). Use desinfectante para manos si no dispone de agua y jabn.  ? Si tiene un vendaje, cmbielo o quteselo siguiendo las indicaciones del mdico.  ? No retire los puntos (suturas), las grapas cutneas, la goma para cerrar la piel o las tiras adhesivas. Es posible que estos cierres cutneos deban permanecer en la piel durante 2semanas o ms tiempo. Si los bordes de las tiras adhesivas empiezan a despegarse y enroscarse, puede recortar los que estn sueltos. No retire las tiras adhesivas por completo a menos que el mdico se lo indique.   Controle todos los das la zona de la incisin para detectar signos de infeccin. Est atento a los  siguientes signos:  ? Aumento del enrojecimiento, la hinchazn o el dolor.  ? Ms lquido o sangre.  ? Calor.  ? Pus o mal olor.   No tome baos de inmersin, no nade ni use un jacuzzi hasta que el mdico la autorice. Pregntele al mdico si puede ducharse.   Cuando tosa o estornude, abrace una almohada. Esto ayuda con el dolor y disminuye la posibilidad de que su incisin se abra (dehiscencia). Haga esto hasta que la incisin cicatrice.  Medicamentos   Tome los medicamentos de venta libre y los recetados solamente como se lo haya indicado el mdico.   Si le recetaron un antibitico, tmelo como se lo haya indicado el mdico. No deje de tomar el antibitico aunque comience a sentirse mejor.   No conduzca ni use maquinaria pesada mientras toma analgsicos recetados.  Estilo de vida   No beba alcohol. Esto es de suma importancia si est amamantando o toma analgsicos.   No consuma ningn producto que contenga nicotina o tabaco, como cigarrillos, cigarrillos electrnicos y tabaco de mascar. Si necesita ayuda para dejar de fumar, consulte al mdico.  Comida y bebida   Beba al menos 8vasos de ochoonzas (240cc) de agua todos los das a menos que el mdico le indique lo contrario. Si amamanta, quiz deba beber an ms cantidad de agua.   Coma alimentos ricos en fibras todos los das. Estos alimentos pueden ayudar a prevenir o aliviar el estreimiento. Los alimentos ricos en fibra incluyen los siguientes:  ?   menos algunos das despus de que le den el alta del hospital.  Retome sus actividades normales como se lo haya indicado el mdico. Pregntele al mdico qu actividades son seguras para usted.  Descanse todo lo que pueda. Trate de descansar o tomar una siesta mientras el beb duerme.  No levante  ningn objeto que pese ms de 10libras (4,5kg) o el lmite de peso que le hayan indicado, hasta que el mdico le diga que puede hacerlo.  Hable con el mdico sobre cundo puede retomar la actividad sexual. Esto puede depender de lo siguiente: ? Riesgo de sufrir una infeccin. ? La rapidez con que cicatrice. ? Comodidad y deseo de retomar la actividad sexual. Indicaciones generales  No use tampones ni se haga duchas vaginales hasta que el mdico la autorice.  Use ropa floja y cmoda y un sostn firme y que le calce bien.  Mantenga el perineo limpio y seco. Cuando vaya al bao, siempre higiencese de adelante hacia atrs.  Si expulsa un cogulo de sangre, gurdelo y llame al mdico para informrselo. No deseche los cogulos de sangre por el inodoro antes de recibir indicaciones del mdico.  Concurra a todas las visitas de seguimiento para usted y el beb, como se lo haya indicado el mdico. Esto es importante. Comunquese con un mdico si:  Tiene los siguientes sntomas: ? Fiebre. ? Secrecin vaginal con mal olor. ? Pus o mal olor en el lugar de la incisin. ? Dificultad o dolor al orinar. ? Aumento o disminucin repentinos de la frecuencia de las deposiciones. ? Aumento del enrojecimiento, la hinchazn o el dolor alrededor de la incisin. ? Aumento del lquido o sangre proveniente de la incisin. ? Erupcin cutnea. ? Nuseas. ? Poco inters o falta de inters en actividades que solan gustarle. ? Dudas sobre su cuidado y el del beb.  Su incisin se siente caliente al tacto.  Siente dolor en las mamas y se ponen rojas o duras.  Siente tristeza o preocupacin de forma inusual.  Vomita.  Elimina un cogulo de sangre grande por la vagina.  Orina ms de lo habitual.  Se siente mareado o aturdido. Solicite ayuda inmediatamente si:  Tiene los siguientes sntomas: ? Dolor que no desaparece o no mejora con medicamentos. ? Dolor en el pecho. ? Dificultad para respirar. ?  Visin borrosa o manchas en la visin. ? Pensamientos de autolesionarse o lesionar al beb. ? Nuevo dolor en el abdomen o en una de las piernas. ? Dolor de cabeza intenso.  Se desmaya.  Tiene una hemorragia tan intensa en la vagina que empapa ms de un apsito en una hora. El sangrado no debe ser ms abundante que el perodo ms intenso que haya tenido. Resumen  Despus del procedimiento, es comn tener dolor en el lugar de la incisin, clicos abdominales, y sangrado vaginal leve.  Controle todos los das la zona de la incisin para detectar signos de infeccin.  Informe al mdico sobre cualquier sntoma inusual.  Concurra a todas las visitas de seguimiento para usted y el beb, como se lo haya indicado el mdico. Esta informacin no tiene como fin reemplazar el consejo del mdico. Asegrese de hacerle al mdico cualquier pregunta que tenga. Document Released: 07/30/2005 Document Revised: 03/12/2018 Document Reviewed: 03/12/2018 Elsevier Patient Education  2020 Elsevier Inc.   

## 2019-04-22 NOTE — Progress Notes (Signed)
Spoke to patient with help form in-house interpreter, Arbie Cookey.  Reviewed pain, bowel function, lactation, and influenza vaccine.  Questions from patient answered.  Waiting for MD rounds to determine disposition.

## 2019-04-22 NOTE — Progress Notes (Signed)
RN noticed slight labored breathing laying in semi-fowler's position in bed.  Pt confirmed that she felt she could not take a full breath.  Fine crackles in bilateral lung bases.  O2 94% on room air.    Pt up to BR for bowel movement, then into chair.  Pt performed IS and placed on continuous O2 monitoring.  Pt states she feels better after walking to and from the bathroom and sitting in chair.  Phoned Dr. Ilda Basset with above information.  See order for chest Xray.

## 2019-04-22 NOTE — Lactation Note (Signed)
This note was copied from a baby's chart. Lactation Consultation Note  Patient Name: Amy Welch [id number] Date: 04/22/2019 Reason for consult: Follow-up assessment   LC arrived for the second attempt to bf.  Interpreter at bedside.  Mom was doing STS.  Infant cueing.  LC explained to mom again all tools to mom and covered cleaning parts, (NS 24, 96fr  Feeding tube, syringe 12 cc, and curved tip to prefil, and how to prime the feeding tube)  LC reviewed hand exp prior to latching infant.  Drops seen.  Mom has not pumped since the 5 pm feed and last use of DEBP was yesterday.  LC reiterated the importance of the DEBP usage for stimulating milk supply.    Mom placed infant in football hold and applied NS.  Similac 20Cal was used to prime 58fr tube and 12 cc syringe.  Infant latched after a few attempts and began rhythmically sucking, swallows were heard.  Tube was placed under shield.  32ml taken and then infant fell asleep.  Infant would not open mouth to even attempt another latch.  LC reviewed bf basics with mom and supply and demand.    RN informed infant took only 5 ml of the 77ml required feed.  Mom is going to pick up her pump at 9 am.  She was encouraged to call out when she desires to attempt breastfeeding the infant again tomorrow.    Tools cleaned and cleaning reviewed with mom and dad.  Dad arrived in NICU during feed.  Mom denies further questions.  Maternal Data    Feeding Feeding Type: Breast Fed  LATCH Score                   Interventions Interventions: Skin to skin;Assisted with latch;Support pillows;Hand express;Adjust position  Lactation Tools Discussed/Used Tools: Nipple Shields Nipple shield size: 24   Consult Status Consult Status: Follow-up Date: 04/23/19 Follow-up type: In-patient    Ferne Coe [facility] 04/22/2019, 9:22 PM

## 2019-04-22 NOTE — Progress Notes (Signed)
Discharge teaching complete with spanish interpretor. Pt understood all information and did not have any questions. Pt discharged home.

## 2019-04-29 ENCOUNTER — Ambulatory Visit (INDEPENDENT_AMBULATORY_CARE_PROVIDER_SITE_OTHER): Payer: Medicaid Other | Admitting: Family Medicine

## 2019-04-29 ENCOUNTER — Other Ambulatory Visit: Payer: Self-pay

## 2019-04-29 ENCOUNTER — Encounter: Payer: Self-pay | Admitting: Family Medicine

## 2019-04-29 VITALS — BP 100/60 | HR 100 | Temp 98.1°F | Wt 166.0 lb

## 2019-04-29 DIAGNOSIS — Z789 Other specified health status: Secondary | ICD-10-CM

## 2019-04-29 DIAGNOSIS — Z283 Underimmunization status: Secondary | ICD-10-CM

## 2019-04-29 DIAGNOSIS — O9989 Other specified diseases and conditions complicating pregnancy, childbirth and the puerperium: Secondary | ICD-10-CM | POA: Diagnosis present

## 2019-04-29 DIAGNOSIS — Z599 Problem related to housing and economic circumstances, unspecified: Secondary | ICD-10-CM | POA: Diagnosis not present

## 2019-04-29 DIAGNOSIS — O09899 Supervision of other high risk pregnancies, unspecified trimester: Secondary | ICD-10-CM

## 2019-04-29 NOTE — Patient Instructions (Signed)
Parto por cesrea, cuidados posteriores Cesarean Delivery, Care After Esta hoja le brinda informacin sobre cmo cuidarse despus del procedimiento. El mdico tambin podr darle indicaciones ms especficas. Comunquese con el mdico si tiene problemas o preguntas. Qu puedo esperar despus del procedimiento? Despus del procedimiento, es comn tener los siguientes sntomas:  Una pequea cantidad de sangre o de lquido transparente que proviene de la incisin.  Enrojecimiento, hinchazn y dolor en la zona de la incisin.  Dolor y molestia abdominales.  Hemorragia vaginal (loquios). Aunque no haya tenido parto vaginal, tendr sangrado y secrecin vaginal.  Calambres plvicos.  Fatiga. Es posible que sienta dolor, hinchazn y molestias en el tejido que se encuentra entre la vagina y el ano (perineo) en los siguientes casos:  Si la cesrea no fue planificada y le permitieron realizar el trabajo de parto y pujar.  Si le hicieron una incisin en la zona (episiotoma) o el tejido se desgarr durante el intento de parto vaginal. Siga estas indicaciones en su casa: Cuidados de la incisin   Siga las indicaciones del mdico acerca del cuidado de la incisin. Asegrese de hacer lo siguiente: ? Lvese las manos con agua y jabn antes de cambiar las vendas (vendaje). Use desinfectante para manos si no dispone de agua y jabn. ? Si tiene un vendaje, cmbielo o quteselo siguiendo las indicaciones del mdico. ? No retire los puntos (suturas), las grapas cutneas, la goma para cerrar la piel o las tiras adhesivas. Es posible que estos cierres cutneos deban permanecer en la piel durante 2semanas o ms tiempo. Si los bordes de las tiras adhesivas empiezan a despegarse y enroscarse, puede recortar los que estn sueltos. No retire las tiras adhesivas por completo a menos que el mdico se lo indique.  Controle todos los das la zona de la incisin para detectar signos de infeccin. Est atento a los  siguientes signos: ? Aumento del enrojecimiento, la hinchazn o el dolor. ? Ms lquido o sangre. ? Calor. ? Pus o mal olor.  No tome baos de inmersin, no nade ni use un jacuzzi hasta que el mdico la autorice. Pregntele al mdico si puede ducharse.  Cuando tosa o estornude, abrace una almohada. Esto ayuda con el dolor y disminuye la posibilidad de que su incisin se abra (dehiscencia). Haga esto hasta que la incisin cicatrice. Medicamentos  Tome los medicamentos de venta libre y los recetados solamente como se lo haya indicado el mdico.  Si le recetaron un antibitico, tmelo como se lo haya indicado el mdico. No deje de tomar el antibitico aunque comience a sentirse mejor.  No conduzca ni use maquinaria pesada mientras toma analgsicos recetados. Estilo de vida  No beba alcohol. Esto es de suma importancia si est amamantando o toma analgsicos.  No consuma ningn producto que contenga nicotina o tabaco, como cigarrillos, cigarrillos electrnicos y tabaco de mascar. Si necesita ayuda para dejar de fumar, consulte al mdico. Comida y bebida  Beba al menos 8vasos de ochoonzas (240cc) de agua todos los das a menos que el mdico le indique lo contrario. Si amamanta, quiz deba beber an ms cantidad de agua.  Coma alimentos ricos en fibras todos los das. Estos alimentos pueden ayudar a prevenir o aliviar el estreimiento. Los alimentos ricos en fibra incluyen los siguientes: ? Panes y cereales integrales. ? Arroz integral. ? Frijoles. ? Frutas y verduras frescas. Actividad   Si es posible, pdale a alguien que la ayude a cuidar del beb y con las tareas del hogar durante al   menos algunos das despus de que le den el alta del hospital.  Retome sus actividades normales como se lo haya indicado el mdico. Pregntele al mdico qu actividades son seguras para usted.  Descanse todo lo que pueda. Trate de descansar o tomar una siesta mientras el beb duerme.  No levante  ningn objeto que pese ms de 10libras (4,5kg) o el lmite de peso que le hayan indicado, hasta que el mdico le diga que puede [place].  Hable con el mdico sobre cundo puede retomar la actividad sexual. Esto puede depender de lo siguiente: ? Riesgo de sufrir una infeccin. ? La rapidez con que cicatrice. ? Comodidad y deseo de [employer] sexual. Indicaciones generales  No use tampones ni se haga duchas vaginales hasta que el mdico la autorice.  Use ropa floja y cmoda y un sostn firme y Nurse, adult.  Mantenga el perineo limpio y seco. Cuando vaya al bao, siempre higiencese de adelante hacia atrs.  Si expulsa un cogulo de sangre, gurdelo y llame al mdico para informrselo. No deseche los cogulos de sangre por el inodoro antes de recibir indicaciones del mdico.  Consulting civil engineer a todas las visitas de seguimiento para usted y Photographer beb, como se lo haya indicado el mdico. Esto es importante. Comunquese con un mdico si:  Tiene los siguientes sntomas: ? [place]. ? Secrecin vaginal con mal olor. ? Pus o mal olor en TEFL teacher de la incisin. ? Dificultad o dolor al [employer]. ? Aumento o disminucin repentinos de la frecuencia de las deposiciones. ? Aumento del enrojecimiento, la hinchazn o el dolor alrededor de la incisin. ? Aumento del lquido o sangre proveniente de la incisin. ? Erupcin cutnea. ? Nuseas. ? Poco inters o falta de inters en actividades que solan gustarle. ? Dudas sobre su cuidado y el del beb.  Su incisin se siente caliente al tacto.  Siente dolor en las mamas y se ponen rojas o duras.  Siente tristeza o preocupacin de forma inusual.  Vomita.  Elimina un cogulo de sangre grande por la vagina.  Orina ms de lo habitual.  Se siente mareado o aturdido. Solicite ayuda inmediatamente si:  Tiene los siguientes sntomas: ? Dolor que no desaparece o no mejora con medicamentos. ? Tourist information centre manager. ? Dificultad para respirar. ?  Visin borrosa o manchas en la visin. ? Pensamientos de autolesionarse o lesionar al beb. ? Primary school teacher abdomen o en una de las piernas. ? Dolor de cabeza intenso.  Se desmaya.  Tiene una hemorragia tan intensa en la vagina que empapa ms de un apsito en [employer]. El sangrado no debe ser ms abundante que el perodo ms intenso que haya tenido. Resumen  Despus del procedimiento, es comn Theatre stage manager de la incisin, clicos abdominales, y sangrado vaginal leve.  [place] zona de la incisin para detectar signos de infeccin.  Informe al mdico sobre cualquier sntoma inusual.  Concurra a todas las visitas de seguimiento para usted y el beb, como se lo haya indicado el mdico. Esta informacin no tiene Marine scientist el consejo del mdico. Asegrese de hacerle al mdico cualquier pregunta que tenga. Document Released: 07/30/2005 Document Revised: 03/12/2018 Document Reviewed: 03/12/2018 Elsevier Patient Education  2020 [employer].

## 2019-04-29 NOTE — Progress Notes (Signed)
SUBJECTIVE CC: Wound Check  PNS:Amy Welch is a 33 y.o. female who presents today with the following problems: Post op follow up  Patient reports that the surgical site continues to be painful, but has been improving.  She reports that she has been putting gauze over the area to help collect some of the draining fluid.  She reports mostly clear fluids draining with only a little bit of purulence.  Pertinent PMFSHx: Emergency cesarean for abnormal fetal heart tones  ROS: Pertinent ROS included in HPI.    OBJECTIVE Physical Exam:  BP 100/60   Pulse 100   Temp 98.1 F (36.7 C) (Oral)   Wt 166 lb (75.3 kg)   SpO2 99%   BMI 27.62 kg/m   Surgical site is clean.  There is no distinct areas of erythema or warmth.  The area continues to be indurated and swollen but is healing well.  There are no signs of dehiscence.    ASSESSMENT & PLAN  Patient needs postpartum MMR. Problem List Items Addressed This Visit      High   Need for financial support    Patient reports that she does not have her Medicaid for family planning.  She reports that she needs to fill out the information for financial assistance.  Will forward to Leory Plowman for further follow-up.      Postpartum care following cesarean delivery    Patient to continue wit daily wound care with warm water and mild soap.  Patient given return precautions.  Pain is manageable without prescription medications.  Patient reports that she does not have a follow-up with the OB providers that perform the surgery at this time. -Follow-up PRN - needs MMR         Medium   Rubella non-immune status, antepartum - Primary     Low   Telephone language interpreter service required      Follow-up: Return if problem recurs,  worsens, or new problem develops.  Wilber Oliphant, M.D.  PGY-2  Family Medicine  [phone] 04/30/2019 2:51 PM

## 2019-04-30 ENCOUNTER — Encounter: Payer: Self-pay | Admitting: Family Medicine

## 2019-04-30 DIAGNOSIS — Z599 Problem related to housing and economic circumstances, unspecified: Secondary | ICD-10-CM | POA: Insufficient documentation

## 2019-04-30 NOTE — Assessment & Plan Note (Signed)
Patient reports that she does not have her Medicaid for family planning.  She reports that she needs to fill out the information for financial assistance.  Will forward to Leory Plowman for further follow-up.

## 2019-04-30 NOTE — Assessment & Plan Note (Signed)
Patient to continue wit daily wound care with warm water and mild soap.  Patient given return precautions.  Pain is manageable without prescription medications.  Patient reports that she does not have a follow-up with the OB providers that perform the surgery at this time. -Follow-up PRN - needs MMR

## 2019-05-04 ENCOUNTER — Ambulatory Visit (INDEPENDENT_AMBULATORY_CARE_PROVIDER_SITE_OTHER): Payer: Self-pay

## 2019-05-04 ENCOUNTER — Other Ambulatory Visit: Payer: Self-pay

## 2019-05-04 ENCOUNTER — Ambulatory Visit: Payer: Self-pay

## 2019-05-04 VITALS — BP 109/65 | HR 69 | Wt 154.7 lb

## 2019-05-04 DIAGNOSIS — Z5189 Encounter for other specified aftercare: Secondary | ICD-10-CM

## 2019-05-04 NOTE — Progress Notes (Addendum)
Spanish Interpreter Mariel G. Pt here for wound check s/p c-section on 04/19/19.  Incision well approximated, no odor, some drainage due to pt keeping the incision covered with cloth, and no redness.  I advised the pt to make sure that she does not cover the incision with anything.  I cleansed incision with sterile water and 4x4 gauzes.  Pt tolerated well.  Pt advised to schedule a pp visit in about 2-3 weeks.  Pt verbalized understanding.    Mel Almond, RN 05/04/19  Patient seen and assessed by nursing staff during this encounter. I have reviewed the chart and agree with the documentation and plan.  Clarisa Fling, NP 05/05/2019 1:05 PM

## 2019-05-13 ENCOUNTER — Ambulatory Visit: Payer: Medicaid Other | Admitting: Family Medicine

## 2019-05-25 ENCOUNTER — Ambulatory Visit: Payer: Self-pay | Admitting: Obstetrics and Gynecology

## 2019-05-27 ENCOUNTER — Ambulatory Visit: Payer: Self-pay | Admitting: Family Medicine

## 2019-06-08 ENCOUNTER — Other Ambulatory Visit: Payer: Self-pay

## 2019-06-08 ENCOUNTER — Encounter: Payer: Self-pay | Admitting: Family Medicine

## 2019-06-08 ENCOUNTER — Ambulatory Visit (INDEPENDENT_AMBULATORY_CARE_PROVIDER_SITE_OTHER): Payer: Self-pay | Admitting: Family Medicine

## 2019-06-08 NOTE — Patient Instructions (Signed)
Dear Nuala Alpha,   It was good to see you! Thank you for taking your time to come in to be seen. Today, we discussed the following:   Postpartum Care   I am glad that you are doing well and that you and baby are healthy!   Please make sure you let the doctor at the health department know that you are breastfeeding before you get your birth control   De Quervain's  tenosynovitis  I attached information about the wrist pain below. Please read it for further information and recommendations for symptomatic care.     Fue bueno verte! Gracias por tomarse su tiempo para venir a ser visto. Anson Crofts, discutimos lo siguiente:  Atencin posparto  Me alegra que ests bien y que t y tu beb estn sanos!  Asegrese de informarle al mdico del departamento de salud que est amamantando antes de recibir su mtodo anticonceptivo.  Tenosinovitis de Education officer, environmental  Adjunto informacin sobre el dolor de [place] a continuacin. Lalo para obtener ms informacin y recomendaciones para el cuidado de los sntomas.  Be well,   Zettie Cooley, M.D   [facility] [phone]  *Sign up for MyChart for instant access to your health profile, labs, orders, upcoming appointments or to contact your provider with questions*  ===================================================================================  Tenosinovitis de Alfonse Ras De Quervain's Tenosynovitis  La tenosinovitis de Alfonse Ras es una afeccin que causa la inflamacin del tendn del lado de la mueca en el que se encuentra el pulgar. Los tendones son cordones de tejido que [employer] con los msculos. Los tendones de la mano pasan a travs de un tnel denominado vaina. Una capa laxa de tejido (sinovia) permite que los tendones se muevan suavemente dentro de la vaina. Con la tenosinovitis de [employer], la vaina se inflama o se engrosa, lo que causa friccin y Social research officer, government. La afeccin tambin se conoce como  enfermedad de [place] y sndrome de Education officer, environmental. Ocurre principalmente en las mujeres que tienen 68 y 73aos. Cules son las causas? Se desconoce la causa exacta de esta afeccin. Puede estar asociada con el uso excesivo de la mano y la [place]. Qu incrementa el riesgo? Es ms probable que tenga esta afeccin si:  [place] las manos ms de lo normal, especialmente si repite ciertos movimientos que incluyen torcer la mano o sujetar objetos con mucha fuerza.  Est embarazada.  Es una mujer de [place].  Tiene artritis reumatoide.  Tiene diabetes. Cules son los signos o los sntomas? El sntoma principal de esta afeccin es Conservation officer, historic buildings en el lado de la mueca en el que se Engineer, maintenance. El dolor puede empeorar cuando sujeta un objeto o tuerce la [place]. Otros sntomas pueden incluir:  Dolor que se extiende Veterinary surgeon.  Hinchazn de la [place] y la [place].  Dificultad para Interior and spatial designer y la [place].  Sensacin de crujido [employer].  Una protuberancia llena de lquido (quiste) en la zona del dolor. Cmo se diagnostica? Esta afeccin se puede diagnosticar en funcin de lo siguiente:  Los sntomas y los antecedentes mdicos.  Un examen fsico. Durante el examen, el mdico puede hacer una prueba sencilla (maniobra de [place]) que implica jalar el pulgar y la [place] para determinar si esto causa dolor. Es posible que tambin deba realizarse una radiografa. Cmo se trata? El tratamiento de esta afeccin puede incluir lo siguiente:  Product/process development scientist cualquier actividad que cause dolor e hinchazn.  Tomar medicamentos. Los  antiinflamatorios y las inyecciones de corticoesteroides pueden reducir la inflamacin y Engineer, materials.  Usar una frula.  Someterse a [place]. Esto puede ser necesario si otros tratamientos no funcionan. Una vez que el dolor y la hinchazn hayan disminuido:  Realizar fisioterapia. Esto incluye ejercicios de elongacin y estiramiento.   Terapia ocupacional. Esto incluye modificar la forma de mover la [place]. Siga estas indicaciones en su casa: Si tiene una frula:  Use la frula como se lo haya indicado el mdico. Qutesela solamente como se lo haya indicado el mdico.  Afloje la frula si los dedos se le adormecen, siente hormigueo o se le enfran y se tornan de Research officer, trade union.  Mantenga la frula limpia.  Si la frula no es impermeable: ? No deje que se moje. ? [place] con un envoltorio hermtico cuando tome un bao de inmersin o [place]. Control del dolor, la rigidez y Geophysical data processor los movimientos y las actividades que causen dolor e hinchazn en la zona de la [place].  Si se lo indican, aplique hielo sobre la zona dolorida. Esto puede ser de ayuda despus de realizar actividades que requieren el uso de la [place] dolorida. ? Ponga el hielo en una bolsa plstica. ? Coloque una [employer] piel y Copy. ? Coloque el hielo durante , 2 a 3veces por da.  Mueva los dedos con frecuencia para evitar la rigidez y Conservator, museum/gallery hinchazn.  Cuando est sentado o acostado, levante (eleve) la zona lesionada por encima del nivel del corazn. Indicaciones generales  Retome sus actividades normales como se lo haya indicado el mdico. Pregntele al mdico qu actividades son seguras para usted.  Tome los medicamentos de venta libre y los recetados solamente como se lo haya indicado el mdico.  Oceanographer a todas las visitas de seguimiento como se lo haya indicado el mdico. Esto es importante. Comunquese con un mdico si:  Los medicamentos no la ayudan.  El dolor [place].  Tiene nuevos sntomas. Resumen  La tenosinovitis de Engineer, maintenance es una afeccin que causa la inflamacin del tendn del lado de la mueca en el que se Diplomatic Services operational officer.  La afeccin ocurre principalmente en las mujeres que tienen 30 y 63aos.  Se desconoce la causa exacta de esta afeccin. Puede estar asociada con el uso  excesivo de la mano y la [place].  El tratamiento comienza con evitar actividades que le causen dolor o hinchazn en la zona de la [place]. Otro tipo tratamiento puede incluir el uso de una frula y tomar medicamentos. En algunos casos se requiere [place]. Esta informacin no tiene Theme park manager el consejo del mdico. Asegrese de hacerle al mdico cualquier pregunta que tenga. Document Released: 07/30/2005 Document Revised: 03/28/2018 Document Reviewed: 08/23/2017 Elsevier Patient Education  2020 [employer].

## 2019-06-08 NOTE — Progress Notes (Signed)
  Subjective  CC: Postpartum Care  HXT:AVWPV Amy Welch is a 33 y.o. female who presents today with the following problems: Postpartum Follow up Patient is doing well. No complaints. Continues to breast feed without issue and is also supplementing with formula. [place] score is 0. Pt plans to f/u at the HD for birth control, she will be using OCPs.   Pertinent P/F/SHx: Rubella non-immune, language barrier, adoptamom  ROS: Pertinent ROS included in HPI. Objective  Physical Exam:  BP 98/68   Pulse 75   Wt 151 lb 9.6 oz (68.8 kg)   SpO2 99%   BMI 25.23 kg/m  General: well appearing female  Abdomen: c section surgical site healing well, clean and intact.   Assessment & Plan    Problem List Items Addressed This Visit      Other   Postpartum care following cesarean delivery    Patient doing well.  - follow up at health department for OCP  - no abnormal bleeding  - No baby blues  - c/s site healing well.  - f/u PRN  - Patient to get MMR at HD         Wilber Oliphant, M.D.  PGY-2  Family Medicine  [phone] 06/13/2019 10:19 PM

## 2019-06-13 ENCOUNTER — Encounter: Payer: Self-pay | Admitting: Family Medicine

## 2019-06-13 NOTE — Assessment & Plan Note (Signed)
Patient doing well.  - follow up at health department for OCP  - no abnormal bleeding  - No baby blues  - c/s site healing well.  - f/u PRN  - Patient to get MMR at HD  

## 2019-08-27 ENCOUNTER — Encounter (HOSPITAL_COMMUNITY): Payer: Self-pay | Admitting: Obstetrics & Gynecology

## 2019-08-29 ENCOUNTER — Encounter (HOSPITAL_COMMUNITY): Payer: Self-pay

## 2019-08-29 DIAGNOSIS — Z79899 Other long term (current) drug therapy: Secondary | ICD-10-CM | POA: Insufficient documentation

## 2019-08-29 DIAGNOSIS — R519 Headache, unspecified: Secondary | ICD-10-CM | POA: Insufficient documentation

## 2019-08-29 DIAGNOSIS — Z87891 Personal history of nicotine dependence: Secondary | ICD-10-CM | POA: Insufficient documentation

## 2019-08-29 NOTE — ED Triage Notes (Signed)
Pt BIB [facility] for a headache and jaw pain. Hx of headaches and reports that this happened earlier this week as well. Reports photosensitivity. 4 months post partum. A&Ox4. Ambulatory. Denies N/V and chest pain.

## 2019-08-30 ENCOUNTER — Emergency Department (HOSPITAL_COMMUNITY)
Admission: EM | Admit: 2019-08-30 | Discharge: 2019-08-30 | Disposition: A | Discharge status: Home / Self Care | Payer: Self-pay | Attending: Emergency Medicine | Admitting: Emergency Medicine

## 2019-08-30 ENCOUNTER — Emergency Department (HOSPITAL_COMMUNITY): Payer: Self-pay

## 2019-08-30 DIAGNOSIS — R519 Headache, unspecified: Secondary | ICD-10-CM

## 2019-08-30 MED ORDER — SODIUM CHLORIDE 0.9 % IV BOLUS
1000.0000 mL | Freq: Once | INTRAVENOUS | Status: AC
  Administered 2019-08-30: 1000 mL via INTRAVENOUS

## 2019-08-30 MED ORDER — METOCLOPRAMIDE HCL 5 MG/ML IJ SOLN
10.0000 mg | Freq: Once | INTRAMUSCULAR | Status: AC
  Administered 2019-08-30: 10 mg via INTRAVENOUS
  Filled 2019-08-30: qty 2

## 2019-08-30 MED ORDER — ACETAMINOPHEN 500 MG PO TABS
1000.0000 mg | ORAL_TABLET | Freq: Once | ORAL | Status: AC
  Administered 2019-08-30: 1000 mg via ORAL
  Filled 2019-08-30: qty 2

## 2019-08-30 NOTE — Discharge Instructions (Signed)
Tylenol 1000 mg rotated with ibuprofen 600 mg every 4 hours as needed for pain.  Follow-up with neurology if your symptoms are not improving in the next few days.  The follow-up information for [facility] neurology has been provided in this discharge summary for you to call and make these arrangements.

## 2019-08-30 NOTE — ED Provider Notes (Signed)
[facility]-EMERGENCY DEPT Provider Note   CSN: [id number] Arrival date & time: 08/29/19  2234     History Chief Complaint  Patient presents with  . Headache    Amy Welch is a 34 y.o. female.  Patient is a 34 year old female with no significant past medical history except for C-section in September 2020.  She presents today for evaluation of headache.  She states that yesterday evening she developed numbness in her right leg that then moved up into her torso and developed headache.  She describes seeing flashing lights and feeling nauseated.  She was brought here by ambulance for evaluation of this.  She received a medication by EMS which seemed to help somewhat.  She reports similar headaches over the past week that have occurred intermittently.  She denies any fevers or chills.  Patient speaks limited [place].  History taken with the use of the translator tablet.  The history is provided by the patient.  Headache Pain location:  Generalized Quality:  Dull Radiates to:  Does not radiate Onset quality:  Gradual Duration:  1 week Timing:  Intermittent Progression:  Worsening Similar to prior headaches: no   Context: not activity   Relieved by:  Nothing Worsened by:  Nothing      Past Medical History:  Diagnosis Date  . AKI (acute kidney injury) (HCC) 04/20/2019  . Postoperative anemia 04/20/2019    Patient Active Problem List   Diagnosis Date Noted  . Postpartum care following cesarean delivery 04/30/2019  . Need for financial support 04/30/2019  . Telephone language interpreter service required 02/24/2019  . Rubella non-immune status, antepartum 02/02/2019    Past Surgical History:  Procedure Laterality Date  . CESAREAN SECTION N/A 04/19/2019   Procedure: CESAREAN SECTION;  Surgeon: Allie Bossier, MD;  Location: [facility] LD ORS;  Service: Obstetrics;  Laterality: N/A;  . NO PAST SURGERIES       OB History    Gravida  1   Para  1   Term  1   Preterm  0   AB  0   Living  1     SAB  0   TAB  0   Ectopic  0   Multiple  0   Live Births  1           History reviewed. No pertinent family history.  Social History   Tobacco Use  . Smoking status: Former Smoker    Types: Cigarettes    Quit date: 12/17/2018    Years since quitting: 0.7  . Smokeless tobacco: Never Used  Substance Use Topics  . Alcohol use: Not Currently    Comment: occasional   . Drug use: Never    Home Medications Prior to Admission medications   Medication Sig Start Date End Date Taking? Authorizing Provider  ibuprofen (ADVIL) 600 MG tablet Take 1 tablet (600 mg total) by mouth every 8 (eight) hours as needed. 04/22/19   Conan Bowens, MD  oxyCODONE (OXY IR/ROXICODONE) 5 MG immediate release tablet Take 1-2 tablets (5-10 mg total) by mouth every 4 (four) hours as needed for moderate pain or severe pain. Patient not taking: Reported on 05/04/2019 04/22/19   Conan Bowens, MD  Prenatal Vit-Fe Fumarate-FA (PRENATAL MULTIVITAMIN) TABS tablet Take 1 tablet by mouth daily at 12 noon.    [provider]    Allergies    Morphine and related and Morphine and related  Review of Systems   Review  of Systems  Neurological: Positive for headaches.  All other systems reviewed and are negative.   Physical Exam Updated Vital Signs BP 119/82 (BP Location: Left Arm)   Pulse 64   Temp 97.7 F (36.5 C) (Oral)   Resp 18   SpO2 100%   Physical Exam Vitals and nursing note reviewed.  Constitutional:      General: She is not in acute distress.    Appearance: She is well-developed. She is not diaphoretic.  HENT:     Head: Normocephalic and atraumatic.  Eyes:     General: No visual field deficit.    Extraocular Movements: Extraocular movements intact.     Pupils: Pupils are equal, round, and reactive to light.  Cardiovascular:     Rate and Rhythm: Normal rate and regular rhythm.     Heart sounds: No murmur. No friction rub. No  gallop.   Pulmonary:     Effort: Pulmonary effort is normal. No respiratory distress.     Breath sounds: Normal breath sounds. No wheezing.  Abdominal:     General: Bowel sounds are normal. There is no distension.     Palpations: Abdomen is soft.     Tenderness: There is no abdominal tenderness.  Musculoskeletal:        General: Normal range of motion.     Cervical back: Normal range of motion and neck supple.  Skin:    General: Skin is warm and dry.  Neurological:     Mental Status: She is alert and oriented to person, place, and time.     Cranial Nerves: No cranial nerve deficit, dysarthria or facial asymmetry.     ED Results / Procedures / Treatments   Labs (all labs ordered are listed, but only abnormal results are displayed) Labs Reviewed - No data to display  EKG None  Radiology No results found.  Procedures Procedures (including critical care time)  Medications Ordered in ED Medications  sodium chloride 0.9 % bolus 1,000 mL (has no administration in time range)  acetaminophen (TYLENOL) tablet 1,000 mg (has no administration in time range)  metoCLOPramide (REGLAN) injection 10 mg (has no administration in time range)    ED Course  I have reviewed the triage vital signs and the nursing notes.  Pertinent labs & imaging results that were available during my care of the patient were reviewed by me and considered in my medical decision making (see chart for details).    MDM Rules/Calculators/A&P  Patient presents here with complaints of headache and numbness as described in the HPI.  I suspect some sort of migraine phenomenon.  She is neurologically intact and feeling better after IV fluids, Tylenol, and Reglan.  Head CT is negative.  At this point, I feel as though she is appropriate for discharge.  I will advise her to rotate Tylenol and ibuprofen as needed.  She will also be given the follow-up information for neurology with whom she can see if symptoms or not  improving in the next few days.  Final Clinical Impression(s) / ED Diagnoses Final diagnoses:  None    Rx / DC Orders ED Discharge Orders    None       Veryl Speak, MD 08/30/19 1022

## 2020-09-18 IMAGING — CT CT ABD-PELV W/O CM
2 of 4 series · 15 of 46 positions shown, 17 images · non-contrast
Comparison: None.

CLINICAL DATA: Acute renal injury. C-section today.

EXAM:
CT ABDOMEN AND PELVIS WITHOUT CONTRAST
TECHNIQUE: Multidetector CT imaging of the abdomen and pelvis was performed
following the standard protocol without IV contrast.

[Series 3: abd/ pelvis 5.0 i30f 2 · axial · 0.83mm/px · z∈[+630,+1070]mm · 12 of 104 slices shown, 14 images]
[im 8/104  soft-tissue]
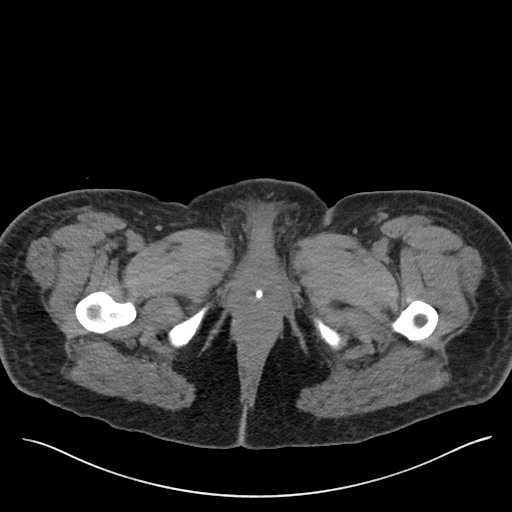
[im 8/104  bone]
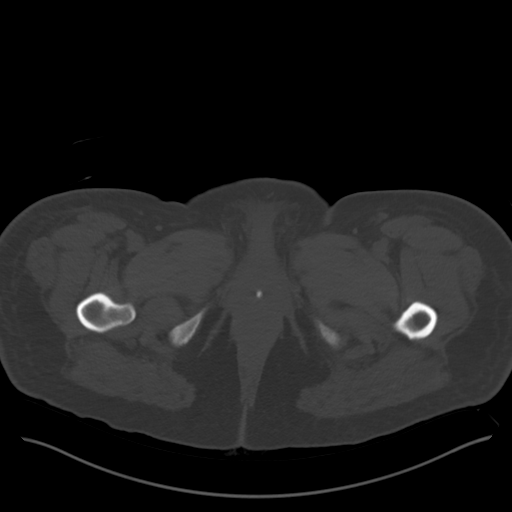
[im 16/104  soft-tissue]
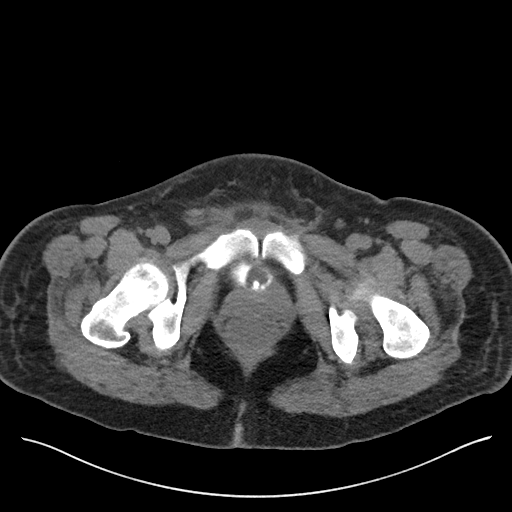
[im 23/104  soft-tissue]
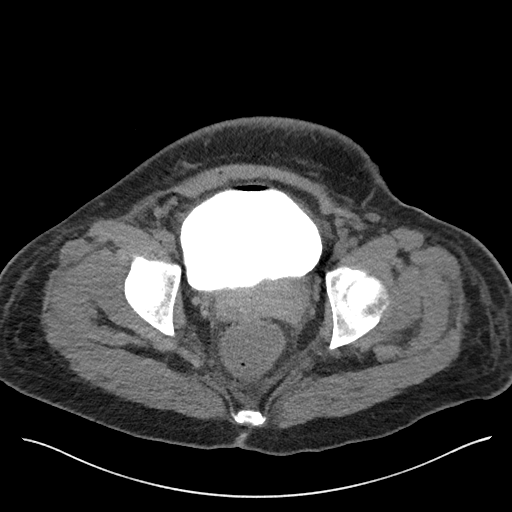
[im 31/104  soft-tissue]
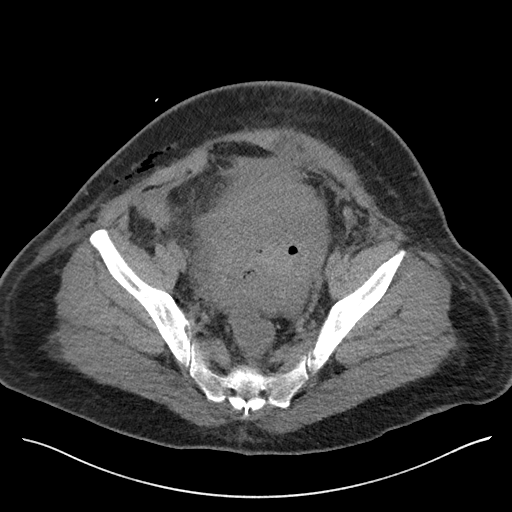
[im 39/104  soft-tissue]
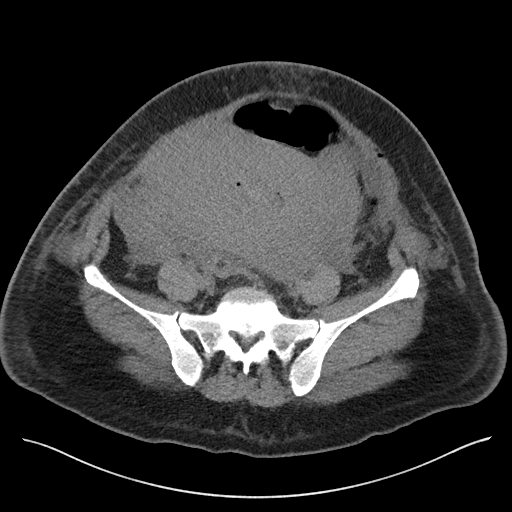
[im 46/104  soft-tissue]
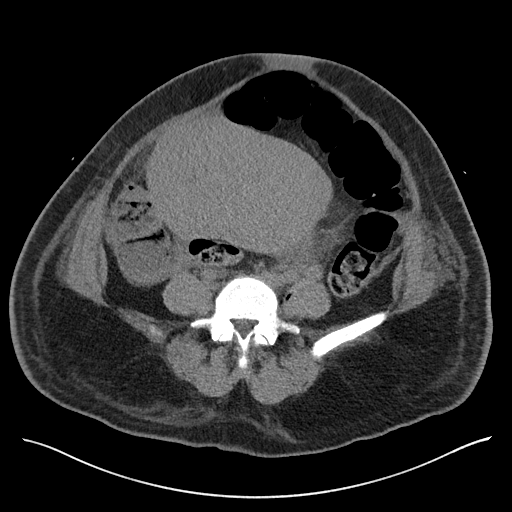
[im 58/104  soft-tissue]
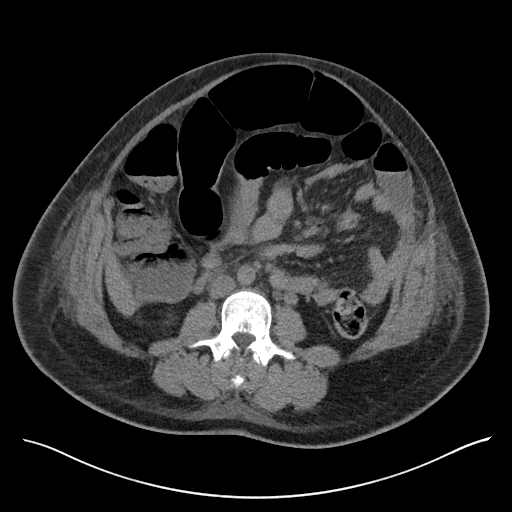
[im 65/104  soft-tissue]
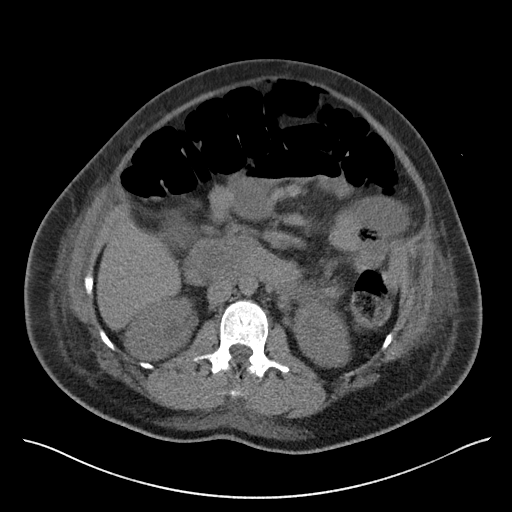
[im 73/104  soft-tissue]
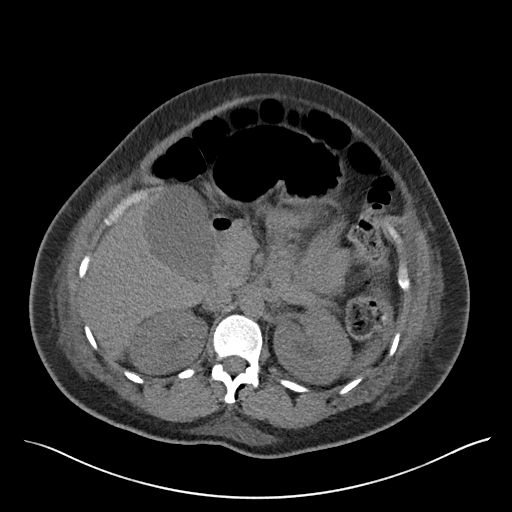
[im 73/104  bone]
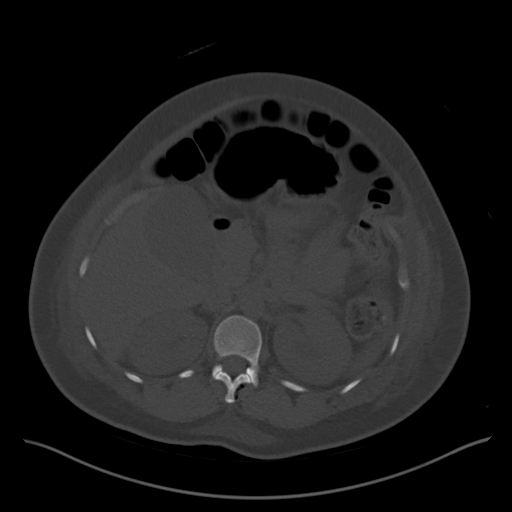
[im 81/104  soft-tissue]
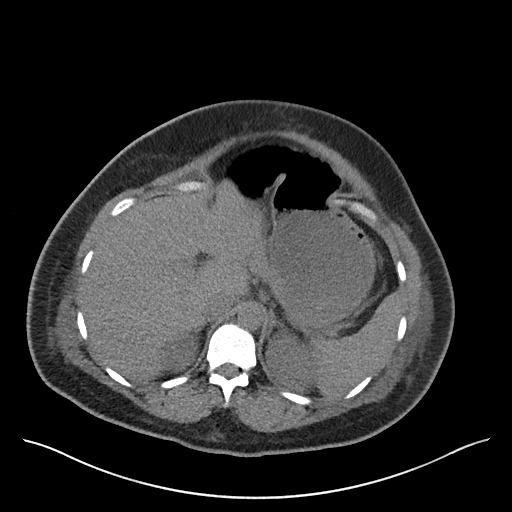
[im 88/104  soft-tissue]
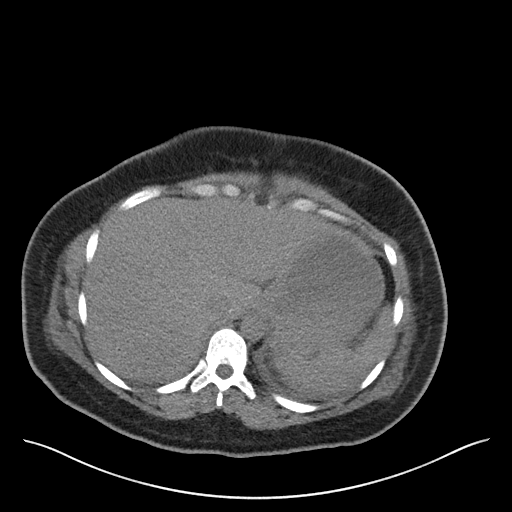
[im 96/104  soft-tissue]
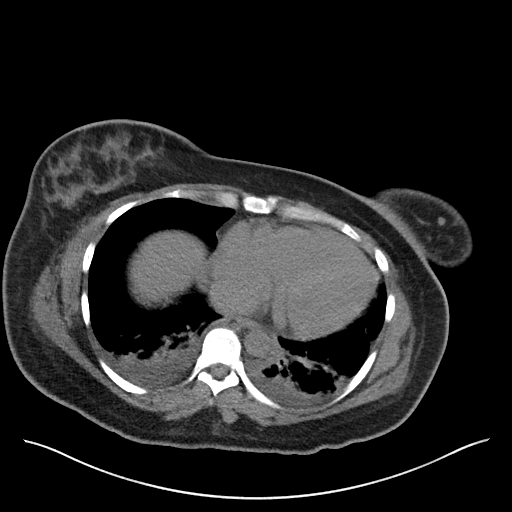

[Series 6: cor st · coronal · 0.76mm/px · 3 of 101 slices shown]
[im 34/101  soft-tissue]
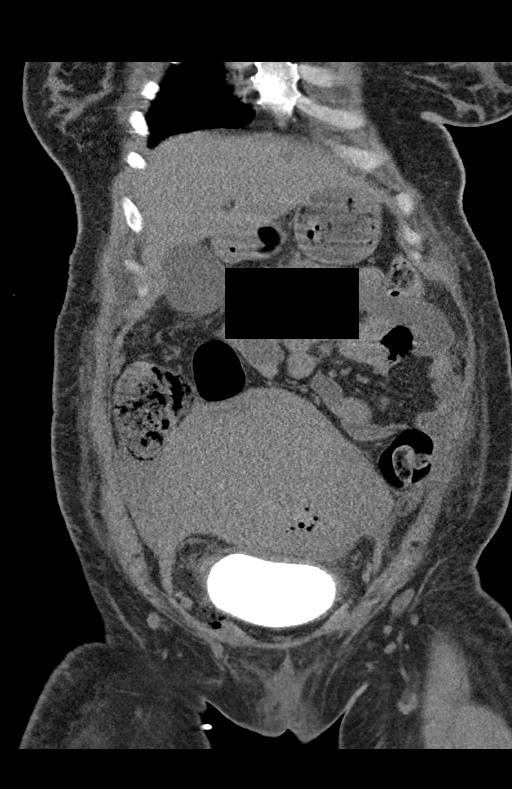
[im 45/101  soft-tissue]
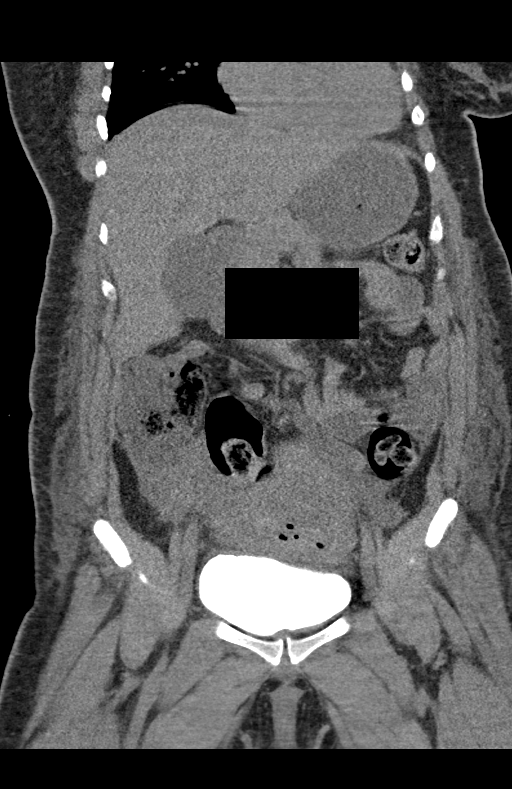
[im 56/101  soft-tissue]
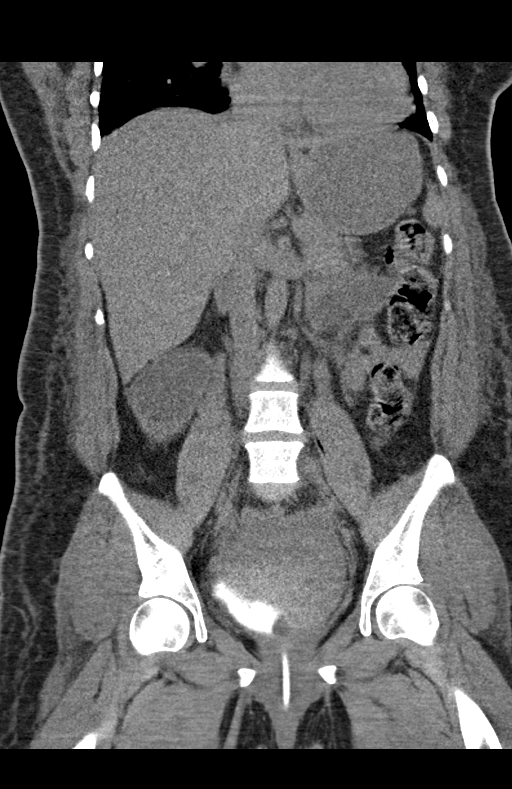

[15 of 46 positions shown; findings below may reference images not displayed]

FINDINGS: Lower chest: Small bilateral pleural effusions. Mild-to-moderate
bilateral dependent atelectasis. Prominent pulmonary vasculature.
Mildly enlarged heart.

Hepatobiliary: Dilated gallbladder. No gallbladder wall thickening
or pericholecystic fluid. No visible gallstones. Normal appearing
liver.

Pancreas: Unremarkable. No pancreatic ductal dilatation or
surrounding inflammatory changes.

Spleen: Normal in size without focal abnormality.

Adrenals/Urinary Tract: Normal appearing adrenal glands. Mild
dilatation of both renal collecting systems and ureters to the level
of a markedly enlarged uterus. Contrast injected through the
patient's Foley catheter in the urinary bladder with an associated
small amount of air in the bladder. The bladder has a normal
appearance with no filling defects seen.

Stomach/Bowel: Redundant colon containing stool and gas. The sigmoid
colon is compressed by the enlarged uterus. Unremarkable stomach and
small bowel. No gross evidence of appendicitis.

Vascular/Lymphatic: No significant vascular findings are present. No
enlarged abdominal or pelvic lymph nodes.

Reproductive: Markedly enlarged uterus containing some fluid, blood
and gas. No visible adnexal mass.

Other: Multiple small locules of free peritoneal air as well as air
in the C-section incision in the anterior subcutaneous fat. Small
amount of free peritoneal fluid.

Musculoskeletal: Minimal lumbar and lower thoracic spine
degenerative changes.
IMPRESSION: 1. Markedly enlarged uterus containing some fluid, blood and gas.
2. Mild bilateral hydronephrosis and hydroureter to the level of the
markedly enlarged uterus, compatible with compression of the ureters
by the uterus.
3. Impression of the sigmoid colon by the enlarged uterus without
obstruction.
4. Contrast injected through the patient's Foley catheter in the
urinary bladder with an associated small amount of air in the
bladder with no visible blood clots or other abnormalities.
5. Small bilateral pleural effusions.
6. Mild-to-moderate bilateral dependent atelectasis.
7. Cardiomegaly and pulmonary vascular congestion.
8. Small amount of free peritoneal fluid and postoperative air.

## 2021-12-31 ENCOUNTER — Inpatient Hospital Stay (HOSPITAL_COMMUNITY)
Admission: AD | Admit: 2021-12-31 | Discharge: 2021-12-31 | Disposition: A | Discharge status: Home / Self Care | Payer: Self-pay | Attending: Obstetrics and Gynecology | Admitting: Obstetrics and Gynecology

## 2021-12-31 ENCOUNTER — Encounter (HOSPITAL_COMMUNITY): Payer: Self-pay

## 2021-12-31 ENCOUNTER — Other Ambulatory Visit: Payer: Self-pay

## 2021-12-31 ENCOUNTER — Inpatient Hospital Stay (HOSPITAL_COMMUNITY): Payer: Self-pay

## 2021-12-31 DIAGNOSIS — O99332 Smoking (tobacco) complicating pregnancy, second trimester: Secondary | ICD-10-CM | POA: Insufficient documentation

## 2021-12-31 DIAGNOSIS — O30001 Twin pregnancy, unspecified number of placenta and unspecified number of amniotic sacs, first trimester: Secondary | ICD-10-CM

## 2021-12-31 DIAGNOSIS — O021 Missed abortion: Secondary | ICD-10-CM

## 2021-12-31 DIAGNOSIS — O039 Complete or unspecified spontaneous abortion without complication: Secondary | ICD-10-CM | POA: Insufficient documentation

## 2021-12-31 DIAGNOSIS — Z3A14 14 weeks gestation of pregnancy: Secondary | ICD-10-CM | POA: Insufficient documentation

## 2021-12-31 LAB — URINALYSIS, ROUTINE W REFLEX MICROSCOPIC
Bilirubin Urine: NEGATIVE
Glucose, UA: NEGATIVE mg/dL
Ketones, ur: 20 mg/dL — AB
Leukocytes,Ua: NEGATIVE
Nitrite: POSITIVE — AB
Protein, ur: NEGATIVE mg/dL
RBC / HPF: >50 RBC/hpf — ABNORMAL HIGH (ref 0–5)
Specific Gravity, Urine: 1.02 (ref 1.005–1.030)
pH: 6 (ref 5.0–8.0)

## 2021-12-31 LAB — WET PREP, GENITAL
Clue Cells Wet Prep HPF POC: NONE SEEN
Sperm: NONE SEEN
Trich, Wet Prep: NONE SEEN
WBC, Wet Prep HPF POC: <10 (ref <10)
Yeast Wet Prep HPF POC: NONE SEEN

## 2021-12-31 LAB — POCT PREGNANCY, URINE: Preg Test, Ur: POSITIVE — AB

## 2021-12-31 NOTE — MAU Provider Note (Signed)
History     CSN: [id number]  Arrival date and time: 12/31/21 1750   Event Date/Time   First Provider Initiated Contact with Patient 12/31/21 2048      Chief Complaint  Patient presents with   Vaginal Bleeding   Abdominal Pain   Amy Welch is a 36 y.o. G2P1001 at 261w1d by Definite LMP of 09/23/2021 [facility], but has a phone visit scheduled with [facility].  She presents today for Vaginal Bleeding and Abdominal Pain.  Patient reports on Friday she started having brownish colored strips that progressed into Saturday.  It then changed to reddish brown with today the bleeding increased and was bright red.  She reports changing her pad twice, but it was not saturated.  She reports passing a blood clot about the size of a grape. She endorses abdominal cramping and denies recent sex.  She also denies issues with urination, constipation, or diarrhea.   OB History     Gravida  2   Para  1   Term  1   Preterm  0   AB  0   Living  1      SAB  0   IAB  0   Ectopic  0   Multiple  0   Live Births  1           Past Medical History:  Diagnosis Date   AKI (acute kidney injury) (HCC) 04/20/2019   Postoperative anemia 04/20/2019    Past Surgical History:  Procedure Laterality Date   CESAREAN SECTION N/A 04/19/2019   Procedure: CESAREAN SECTION;  Surgeon: Allie Bossierove, Myra C, MD;  Location: [facility] LD ORS;  Service: Obstetrics;  Laterality: N/A;   NO PAST SURGERIES      History reviewed. No pertinent family history.  Social History   Tobacco Use   Smoking status: Former    Types: Cigarettes    Quit date: 12/17/2018    Years since quitting: 3.0   Smokeless tobacco: Never  Vaping Use   Vaping Use: Never used  Substance Use Topics   Alcohol use: Not Currently    Comment: occasional    Drug use: Never    Allergies:  Allergies  Allergen Reactions   Morphine And Related     Reports sweating, SOB and lightheaded    Morphine And Related Nausea And  Vomiting    Medications Prior to Admission  Medication Sig Dispense Refill Last Dose   Prenatal Vit-Fe Fumarate-FA (PRENATAL MULTIVITAMIN) TABS tablet Take 1 tablet by mouth daily at 12 noon.   12/30/2021   ibuprofen (ADVIL) 600 MG tablet Take 1 tablet (600 mg total) by mouth every 8 (eight) hours as needed. 20 tablet 0    oxyCODONE (OXY IR/ROXICODONE) 5 MG immediate release tablet Take 1-2 tablets (5-10 mg total) by mouth every 4 (four) hours as needed for moderate pain or severe pain. (Patient not taking: Reported on 05/04/2019) 20 tablet 0     Review of Systems  Gastrointestinal:  Positive for abdominal pain (5/10).  Genitourinary:  Positive for vaginal bleeding and vaginal discharge. Negative for difficulty urinating and dysuria.  Physical Exam   Blood pressure 119/68, pulse 81, temperature 98.7 F (37.1 C), temperature source Oral, resp. rate 18, height 5\' 5"  (1.651 m), weight 81.8 kg, last menstrual period 09/23/2021, SpO2 99 %, unknown if currently breastfeeding.  Physical Exam Vitals reviewed. Exam conducted with a chaperone present.  Constitutional:      Appearance: She is well-developed.  HENT:     Head: Normocephalic and atraumatic.  Cardiovascular:     Rate and Rhythm: Normal rate.  Pulmonary:     Effort: Pulmonary effort is normal. No respiratory distress.  Abdominal:     General: Bowel sounds are normal.     Palpations: Abdomen is soft.     Tenderness: There is abdominal tenderness in the left lower quadrant.  Genitourinary:    Cervix: Cervical motion tenderness present.     Comments: Speculum Exam: -Normal External Genitalia: Non tender, Moderate amt blood noted at introitus.  -Vaginal Vault: Pink mucosa with good rugae. Scant amt mucoid type bloody discharge in vault -wet prep and GC/CT collected -Cervix: Not fully visualized -Bimanual Exam:  Cervix closed, CMT reported.  Neurological:     Mental Status: She is alert.    MAU Course  Procedures Results for  orders placed or performed during the hospital encounter of 12/31/21 (from the past 24 hour(s))  Pregnancy, urine POC     Status: Abnormal   Collection Time: 12/31/21  7:20 PM  Result Value Ref Range   Preg Test, Ur POSITIVE (A) NEGATIVE  Urinalysis, Routine w reflex microscopic     Status: Abnormal   Collection Time: 12/31/21  7:25 PM  Result Value Ref Range   Color, Urine YELLOW YELLOW   APPearance HAZY (A) CLEAR   Specific Gravity, Urine 1.020 1.005 - 1.030   pH 6.0 5.0 - 8.0   Glucose, UA NEGATIVE NEGATIVE mg/dL   Hgb urine dipstick LARGE (A) NEGATIVE   Bilirubin Urine NEGATIVE NEGATIVE   Ketones, ur 20 (A) NEGATIVE mg/dL   Protein, ur NEGATIVE NEGATIVE mg/dL   Nitrite POSITIVE (A) NEGATIVE   Leukocytes,Ua NEGATIVE NEGATIVE   RBC / HPF >50 (H) 0 - 5 RBC/hpf   WBC, UA 11-20 0 - 5 WBC/hpf   Bacteria, UA RARE (A) NONE SEEN   Squamous Epithelial / LPF 0-5 0 - 5   Mucus PRESENT    Hyaline Casts, UA PRESENT   Wet prep, genital     Status: None   Collection Time: 12/31/21  9:05 PM  Result Value Ref Range   Yeast Wet Prep HPF POC NONE SEEN NONE SEEN   Trich, Wet Prep NONE SEEN NONE SEEN   Clue Cells Wet Prep HPF POC NONE SEEN NONE SEEN   WBC, Wet Prep HPF POC <10 <10   Sperm NONE SEEN    US OB Comp AddL Gest Less 14 Wks  Result Date: 12/31/2021 CLINICAL DATA:  Vaginal bleeding and cramping. Passing clots. LMP: 09/23/2021 corresponding to an estimated gestational age of [redacted] weeks, 1 day. EXAM: TWIN OBSTETRIC <14WK [place] AND TRANSVAGINAL OB US TECHNIQUE: Both transabdominal and transvaginal ultrasound examinations were performed for complete evaluation of the gestation as well as the maternal uterus, adnexal regions, and pelvic cul-de-sac. Transvaginal technique was performed to assess early pregnancy. COMPARISON:  None Available. FINDINGS: Number of IUPs:  2 Chorionicity/Amnionicity:  Monochorionic-diamniotic (thin membrane) TWIN 1 Yolk sac:  Not seen Embryo:  Present Cardiac Activity: Not  detected CRL:  22 mm   8 w 5 d                  [place] EDC: 08/07/2022 TWIN 2 Yolk sac:  Not seen Embryo:  Present Cardiac Activity: Not detected CRL:  29 mm   9 w 5 d                  [place] EDC: 07/31/2022 Subchorionic hemorrhage:  None visualized. Maternal uterus/adnexae: The maternal ovaries are unremarkable. IMPRESSION: Twin intrauterine pregnancy. No embryonic cardiac activity detected in either twin. Electronically Signed   By: Elgie Collard M.D.   On: 12/31/2021 22:00   US OB LESS THAN 14 WEEKS WITH OB TRANSVAGINAL  Result Date: 12/31/2021 CLINICAL DATA:  Vaginal bleeding and cramping. Passing clots. LMP: 09/23/2021 corresponding to an estimated gestational age of [redacted] weeks, 1 day. EXAM: TWIN OBSTETRIC <14WK [place] AND TRANSVAGINAL OB US TECHNIQUE: Both transabdominal and transvaginal ultrasound examinations were performed for complete evaluation of the gestation as well as the maternal uterus, adnexal regions, and pelvic cul-de-sac. Transvaginal technique was performed to assess early pregnancy. COMPARISON:  None Available. FINDINGS: Number of IUPs:  2 Chorionicity/Amnionicity:  Monochorionic-diamniotic (thin membrane) TWIN 1 Yolk sac:  Not seen Embryo:  Present Cardiac Activity: Not detected CRL:  22 mm   8 w 5 d                  [place] EDC: 08/07/2022 TWIN 2 Yolk sac:  Not seen Embryo:  Present Cardiac Activity: Not detected CRL:  29 mm   9 w 5 d                  [place] EDC: 07/31/2022 Subchorionic hemorrhage:  None visualized. Maternal uterus/adnexae: The maternal ovaries are unremarkable. IMPRESSION: Twin intrauterine pregnancy. No embryonic cardiac activity detected in either twin. Electronically Signed   By: Elgie Collard M.D.   On: 12/31/2021 22:00    MDM Pelvic Exam; Wet Prep and GC/CT Labs: UA, UPT, CBC, hCG, ABO Ultrasound Consult Assessment and Plan  36 year old G2P1001 at 14.2 weeks Vaginal Bleeding Language Barrier  -POC Reviewed -Exam performed and findings discussed. -Cultures  collected. -Patient offered and declines pain medication. -[place] ordered. Will await results.   Cherre Robins 12/31/2021, 8:49 PM   Reassessment (10:23 PM) Twin MAB  -Results as above. -Dr. Mitzi Hansen consulted and informed of patient status, evaluation, interventions, and results. Advised: *Inform patient of need for D&C as cytotec dosing not appropriate considering size of twin gestation.  *Will send message to J.Battle for coordination and scheduling of procedure.  -Provider to bedside to inform patient of US findings and recommendations. -Condolences given. -Patient questions addressed. -Instructed to await phone call regarding appt procedure. -Given information about D&C and miscarriage in AVS. -Bleeding precautions reviewed. -Encouraged to call primary office or return to MAU if symptoms worsen or with the onset of new symptoms. -Discharged to home in stable condition.  Cherre Robins MSN, CNM Advanced Practice Provider, Center for [facility]'  Addendum 0125 -UA shows +nitrites -Order placed for urine culture.   Cherre Robins MSN, CNM Advanced Practice Provider, Center for [employer]

## 2021-12-31 NOTE — MAU Note (Signed)
..  Amy Welch is a 36 y.o. at Unknown here in MAU reporting:VB yesterday was brownish with clots, that conitinued today with more watery with bright red blood and transitioned 3 hours ago  bright red bleeding with large clots. Pt reports having left sided ABD constant cramping since Friday. Pt wearing a pad, and been through 2 pads today. Pt denies abnormal discharge. Pt took a home preg test some time in March that was positive.  LMP: 09/23/2021 Onset of complaint: Friday Pain score: 5/10 left ABD Vitals:   12/31/21 2003  BP: 119/68  Pulse: 81  Resp: 18  Temp: 98.7 F (37.1 C)  SpO2: 99%      Lab orders placed from triage:  POCT urine preg, UA

## 2022-01-01 ENCOUNTER — Other Ambulatory Visit: Payer: Self-pay | Admitting: Obstetrics and Gynecology

## 2022-01-01 ENCOUNTER — Other Ambulatory Visit (HOSPITAL_COMMUNITY): Payer: Self-pay | Admitting: Obstetrics and Gynecology

## 2022-01-01 ENCOUNTER — Telehealth: Payer: Self-pay | Admitting: Obstetrics and Gynecology

## 2022-01-01 ENCOUNTER — Other Ambulatory Visit: Payer: Self-pay

## 2022-01-01 DIAGNOSIS — R8271 Bacteriuria: Secondary | ICD-10-CM

## 2022-01-01 DIAGNOSIS — O021 Missed abortion: Secondary | ICD-10-CM

## 2022-01-01 LAB — GC/CHLAMYDIA PROBE AMP (~~LOC~~) NOT AT ARMC
Chlamydia: NEGATIVE
Comment: NEGATIVE
Comment: NORMAL
Neisseria Gonorrhea: NEGATIVE

## 2022-01-01 NOTE — Addendum Note (Signed)
Addended by: Gerrit Heck L on: 01/01/2022 01:23 AM   Modules accepted: Orders

## 2022-01-01 NOTE — Telephone Encounter (Signed)
Telephone call to patient to notify her of surgery date/time.  Patient is scheduled for 01/03/22 at 9:45 am at [facility] OR.  Instructed patient to arrive at 8am and to be NPO.   Instructed patient to check in at Entrance A.   Call completed with [facility].

## 2022-01-02 ENCOUNTER — Other Ambulatory Visit: Payer: Self-pay

## 2022-01-02 ENCOUNTER — Encounter (HOSPITAL_COMMUNITY): Payer: Self-pay | Admitting: Obstetrics and Gynecology

## 2022-01-02 ENCOUNTER — Other Ambulatory Visit: Payer: Self-pay | Admitting: Obstetrics and Gynecology

## 2022-01-02 DIAGNOSIS — O039 Complete or unspecified spontaneous abortion without complication: Secondary | ICD-10-CM

## 2022-01-02 NOTE — Progress Notes (Signed)
Spoke with pt for pre-op via [address] (Spanish) Interpreter, [place] ID # [phone]. Pt denies any heart history, diabetes or HTN.   Shower instructions given to pt.

## 2022-01-03 ENCOUNTER — Ambulatory Visit (HOSPITAL_COMMUNITY)
Admission: RE | Admit: 2022-01-03 | Discharge: 2022-01-03 | Disposition: A | Discharge status: Home / Self Care | Payer: Self-pay | Source: Ambulatory Visit | Attending: Obstetrics and Gynecology | Admitting: Obstetrics and Gynecology

## 2022-01-03 ENCOUNTER — Encounter (HOSPITAL_COMMUNITY)
Admission: RE | Disposition: A | Discharge status: Home / Self Care | Payer: Self-pay | Source: Home / Self Care | Attending: Obstetrics and Gynecology

## 2022-01-03 ENCOUNTER — Encounter (HOSPITAL_COMMUNITY): Payer: Self-pay | Admitting: Obstetrics and Gynecology

## 2022-01-03 ENCOUNTER — Ambulatory Visit (HOSPITAL_COMMUNITY): Payer: Self-pay | Admitting: Certified Registered Nurse Anesthetist

## 2022-01-03 ENCOUNTER — Ambulatory Visit (HOSPITAL_BASED_OUTPATIENT_CLINIC_OR_DEPARTMENT_OTHER): Payer: Self-pay | Admitting: Certified Registered Nurse Anesthetist

## 2022-01-03 ENCOUNTER — Ambulatory Visit (HOSPITAL_COMMUNITY)
Admission: RE | Admit: 2022-01-03 | Discharge: 2022-01-03 | Disposition: A | Discharge status: Home / Self Care | Payer: Self-pay | Attending: Obstetrics and Gynecology | Admitting: Obstetrics and Gynecology

## 2022-01-03 ENCOUNTER — Other Ambulatory Visit: Payer: Self-pay

## 2022-01-03 DIAGNOSIS — O30041 Twin pregnancy, dichorionic/diamniotic, first trimester: Secondary | ICD-10-CM | POA: Diagnosis present

## 2022-01-03 DIAGNOSIS — O021 Missed abortion: Secondary | ICD-10-CM | POA: Insufficient documentation

## 2022-01-03 DIAGNOSIS — O039 Complete or unspecified spontaneous abortion without complication: Secondary | ICD-10-CM

## 2022-01-03 DIAGNOSIS — Z789 Other specified health status: Secondary | ICD-10-CM | POA: Diagnosis present

## 2022-01-03 DIAGNOSIS — N889 Noninflammatory disorder of cervix uteri, unspecified: Secondary | ICD-10-CM

## 2022-01-03 DIAGNOSIS — Z87891 Personal history of nicotine dependence: Secondary | ICD-10-CM | POA: Insufficient documentation

## 2022-01-03 DIAGNOSIS — Z9889 Other specified postprocedural states: Secondary | ICD-10-CM

## 2022-01-03 DIAGNOSIS — Z758 Other problems related to medical facilities and other health care: Secondary | ICD-10-CM | POA: Diagnosis present

## 2022-01-03 DIAGNOSIS — Z3A09 9 weeks gestation of pregnancy: Secondary | ICD-10-CM

## 2022-01-03 HISTORY — PX: DILATION AND EVACUATION: SHX1459

## 2022-01-03 HISTORY — DX: Headache, unspecified: R51.9

## 2022-01-03 HISTORY — PX: OPERATIVE ULTRASOUND: SHX5996

## 2022-01-03 LAB — CULTURE, OB URINE: Culture: >=100000 — AB

## 2022-01-03 LAB — CBC
HCT: 45.4 % (ref 36.0–46.0)
Hemoglobin: 15.4 g/dL — ABNORMAL HIGH (ref 12.0–15.0)
MCH: 30.6 pg (ref 26.0–34.0)
MCHC: 33.9 g/dL (ref 30.0–36.0)
MCV: 90.1 fL (ref 80.0–100.0)
Platelets: 251 10*3/uL (ref 150–400)
RBC: 5.04 MIL/uL (ref 3.87–5.11)
RDW: 12.8 % (ref 11.5–15.5)
WBC: 8.6 10*3/uL (ref 4.0–10.5)
nRBC: 0 % (ref 0.0–0.2)

## 2022-01-03 LAB — TYPE AND SCREEN
ABO/RH(D): O POS
Antibody Screen: NEGATIVE

## 2022-01-03 SURGERY — DILATION AND EVACUATION, UTERUS
Anesthesia: General | Site: Vagina

## 2022-01-03 MED ORDER — ONDANSETRON HCL 4 MG/2ML IJ SOLN
INTRAMUSCULAR | Status: DC | PRN
  Administered 2022-01-03: 4 mg via INTRAVENOUS

## 2022-01-03 MED ORDER — PHENYLEPHRINE 80 MCG/ML (10ML) SYRINGE FOR IV PUSH (FOR BLOOD PRESSURE SUPPORT)
PREFILLED_SYRINGE | INTRAVENOUS | Status: DC | PRN
  Administered 2022-01-03: 80 ug via INTRAVENOUS

## 2022-01-03 MED ORDER — CEFADROXIL 500 MG PO CAPS: 500.0000 mg | ORAL_CAPSULE | Freq: Two times a day (BID) | ORAL | 0 refills | Status: DC

## 2022-01-03 MED ORDER — DOXYCYCLINE HYCLATE 100 MG IV SOLR
200.0000 mg | Freq: Once | INTRAVENOUS | Status: AC
  Administered 2022-01-03: 200 mg via INTRAVENOUS
  Filled 2022-01-03: qty 200

## 2022-01-03 MED ORDER — LIDOCAINE HCL 1 % IJ SOLN
INTRAMUSCULAR | Status: AC
  Filled 2022-01-03: qty 20

## 2022-01-03 MED ORDER — IBUPROFEN 600 MG PO TABS: 600.0000 mg | ORAL_TABLET | Freq: Four times a day (QID) | ORAL | 0 refills | Status: DC | PRN

## 2022-01-03 MED ORDER — 0.9 % SODIUM CHLORIDE (POUR BTL) OPTIME
TOPICAL | Status: DC | PRN
  Administered 2022-01-03: 1000 mL

## 2022-01-03 MED ORDER — PROPOFOL 10 MG/ML IV BOLUS
INTRAVENOUS | Status: AC
  Filled 2022-01-03: qty 20

## 2022-01-03 MED ORDER — FENTANYL CITRATE (PF) 250 MCG/5ML IJ SOLN
INTRAMUSCULAR | Status: DC | PRN
  Administered 2022-01-03 (×2): 50 ug via INTRAVENOUS

## 2022-01-03 MED ORDER — MIDAZOLAM HCL 2 MG/2ML IJ SOLN
INTRAMUSCULAR | Status: DC | PRN
Start: 2022-01-03 — End: 2022-01-03
  Administered 2022-01-03: 2 mg via INTRAVENOUS

## 2022-01-03 MED ORDER — DEXAMETHASONE SODIUM PHOSPHATE 10 MG/ML IJ SOLN
INTRAMUSCULAR | Status: AC
  Filled 2022-01-03: qty 1

## 2022-01-03 MED ORDER — SODIUM CHLORIDE 0.9 % IV SOLN: INTRAVENOUS | Status: DC

## 2022-01-03 MED ORDER — LACTATED RINGERS IV SOLN: INTRAVENOUS | Status: DC

## 2022-01-03 MED ORDER — CHLORHEXIDINE GLUCONATE 0.12 % MT SOLN: 15.0000 mL | Freq: Once | OROMUCOSAL | Status: AC

## 2022-01-03 MED ORDER — DEXAMETHASONE SODIUM PHOSPHATE 10 MG/ML IJ SOLN
INTRAMUSCULAR | Status: DC | PRN
  Administered 2022-01-03: 10 mg via INTRAVENOUS

## 2022-01-03 MED ORDER — OXYCODONE-ACETAMINOPHEN 5-325 MG PO TABS: 1.0000 | ORAL_TABLET | Freq: Four times a day (QID) | ORAL | 0 refills | Status: DC | PRN

## 2022-01-03 MED ORDER — MIDAZOLAM HCL 2 MG/2ML IJ SOLN
INTRAMUSCULAR | Status: AC
  Filled 2022-01-03: qty 2

## 2022-01-03 MED ORDER — LIDOCAINE 2% (20 MG/ML) 5 ML SYRINGE
INTRAMUSCULAR | Status: AC
  Filled 2022-01-03: qty 5

## 2022-01-03 MED ORDER — PHENYLEPHRINE 80 MCG/ML (10ML) SYRINGE FOR IV PUSH (FOR BLOOD PRESSURE SUPPORT)
PREFILLED_SYRINGE | INTRAVENOUS | Status: AC
  Filled 2022-01-03: qty 10

## 2022-01-03 MED ORDER — ONDANSETRON HCL 4 MG/2ML IJ SOLN
INTRAMUSCULAR | Status: AC
  Filled 2022-01-03: qty 2

## 2022-01-03 MED ORDER — ACETAMINOPHEN 500 MG PO TABS
1000.0000 mg | ORAL_TABLET | Freq: Once | ORAL | Status: AC
  Administered 2022-01-03: 1000 mg via ORAL
  Filled 2022-01-03: qty 2

## 2022-01-03 MED ORDER — ORAL CARE MOUTH RINSE: 15.0000 mL | Freq: Once | OROMUCOSAL | Status: AC

## 2022-01-03 MED ORDER — PROPOFOL 10 MG/ML IV BOLUS
INTRAVENOUS | Status: DC | PRN
  Administered 2022-01-03: 180 mg via INTRAVENOUS
  Administered 2022-01-03: 20 mg via INTRAVENOUS

## 2022-01-03 MED ORDER — FENTANYL CITRATE (PF) 100 MCG/2ML IJ SOLN: 25.0000 ug | INTRAMUSCULAR | Status: DC | PRN

## 2022-01-03 MED ORDER — FENTANYL CITRATE (PF) 250 MCG/5ML IJ SOLN
INTRAMUSCULAR | Status: AC
  Filled 2022-01-03: qty 5

## 2022-01-03 MED ORDER — LIDOCAINE HCL 1 % IJ SOLN
INTRAMUSCULAR | Status: DC | PRN
  Administered 2022-01-03: 20 mL

## 2022-01-03 MED ORDER — KETOROLAC TROMETHAMINE 30 MG/ML IJ SOLN
INTRAMUSCULAR | Status: DC | PRN
  Administered 2022-01-03: 30 mg via INTRAVENOUS

## 2022-01-03 MED ORDER — KETOROLAC TROMETHAMINE 30 MG/ML IJ SOLN
INTRAMUSCULAR | Status: AC
  Filled 2022-01-03: qty 1

## 2022-01-03 MED ORDER — LIDOCAINE 2% (20 MG/ML) 5 ML SYRINGE
INTRAMUSCULAR | Status: DC | PRN
  Administered 2022-01-03: 60 mg via INTRAVENOUS

## 2022-01-03 MED ORDER — CHLORHEXIDINE GLUCONATE 0.12 % MT SOLN
OROMUCOSAL | Status: AC
Start: 2022-01-03 — End: 2022-01-03
  Administered 2022-01-03: 15 mL via OROMUCOSAL
  Filled 2022-01-03: qty 15

## 2022-01-03 MED ORDER — DOCUSATE SODIUM 100 MG PO CAPS: 100.0000 mg | ORAL_CAPSULE | Freq: Two times a day (BID) | ORAL | 0 refills | Status: DC

## 2022-01-03 SURGICAL SUPPLY — 20 items
FILTER UTR ASPR ASSEMBLY (MISCELLANEOUS) ×3 IMPLANT
GLOVE BIOGEL PI IND STRL 7.0 (GLOVE) ×2 IMPLANT
GLOVE BIOGEL PI IND STRL 7.5 (GLOVE) ×2 IMPLANT
GLOVE BIOGEL PI INDICATOR 7.0 (GLOVE) ×1
GLOVE BIOGEL PI INDICATOR 7.5 (GLOVE) ×1
GLOVE NEODERM STER SZ 7 (GLOVE) ×3 IMPLANT
GOWN STRL REUS W/ TWL LRG LVL3 (GOWN DISPOSABLE) ×2 IMPLANT
GOWN STRL REUS W/ TWL XL LVL3 (GOWN DISPOSABLE) ×2 IMPLANT
GOWN STRL REUS W/TWL LRG LVL3 (GOWN DISPOSABLE) ×1
GOWN STRL REUS W/TWL XL LVL3 (GOWN DISPOSABLE) ×1
HOSE CONNECTING 18IN BERKELEY (TUBING) ×3 IMPLANT
KIT BERKELEY 1ST TRI 3/8 NO TR (MISCELLANEOUS) ×3 IMPLANT
KIT BERKELEY 1ST TRIMESTER 3/8 (MISCELLANEOUS) ×3 IMPLANT
NS IRRIG 1000ML POUR BTL (IV SOLUTION) ×3 IMPLANT
PACK VAGINAL MINOR WOMEN LF (CUSTOM PROCEDURE TRAY) ×3 IMPLANT
PAD OB MATERNITY 4.3X12.25 (PERSONAL CARE ITEMS) ×3 IMPLANT
SET BERKELEY SUCTION TUBING (SUCTIONS) ×3 IMPLANT
TOWEL GREEN STERILE FF (TOWEL DISPOSABLE) ×6 IMPLANT
VACURETTE 10 RIGID CVD (CANNULA) ×1 IMPLANT
VACURETTE 12 RIGID CVD (CANNULA) ×1 IMPLANT

## 2022-01-03 NOTE — Anesthesia Procedure Notes (Signed)
Procedure Name: LMA Insertion Date/Time: 01/03/2022 9:58 AM Performed by: Dorthea Cove, CRNA Pre-anesthesia Checklist: Patient identified, Emergency Drugs available, Suction available and Patient being monitored Patient Re-evaluated:Patient Re-evaluated prior to induction Oxygen Delivery Method: Circle System Utilized Preoxygenation: Pre-oxygenation with 100% oxygen Induction Type: IV induction Ventilation: Mask ventilation without difficulty LMA: LMA inserted LMA Size: 4.0 Number of attempts: 1 Airway Equipment and Method: Bite block Placement Confirmation: positive ETCO2 Tube secured with: Tape Dental Injury: Teeth and Oropharynx as per pre-operative assessment

## 2022-01-03 NOTE — H&P (Addendum)
Obstetrics & Gynecology Surgical H&P   Date of Surgery: 01/03/2022    Primary OBGYN: None  Reason for Admission: 9wk miscarriage of di-di twins  History of Present Illness: Ms. Amy Welch is a 36 y.o. G2P1001 (Patient's last menstrual period was 09/23/2021 (approximate).), with the above CC. PMHx is significant for AMA, h/o c-section  Patient presented to [facility] on 5/21 for pain and bleeding and diagnosed with above. Some cramping and small clots (none today) since then. Due to size of pregnancies, surgical management recommended which she was amenable to.   ROS: A 12-point review of systems was performed and negative, except as stated in the above HPI.  OBGYN History: As per HPI. OB History  Gravida Para Term Preterm AB Living  2 1 1  0 0 1  SAB IAB Ectopic Multiple Live Births  0 0 0 0 1    # Outcome Date GA Lbr Len/2nd Weight Sex Delivery Anes PTL Lv  2 Current           1 Term 04/19/19 [redacted]w[redacted]d 17:59 / 01:23 3940 g M CS-LTranv EPI  LIV    Past Medical History:  Diagnosis Date   AKI (acute kidney injury) (HCC) 04/20/2019   pt is not aware of this   Headache    none for several years   Postoperative anemia 04/20/2019    Past Surgical History: Past Surgical History:  Procedure Laterality Date   CESAREAN SECTION N/A 04/19/2019   Procedure: CESAREAN SECTION;  Surgeon: 06/19/2019, MD;  Location: [facility];  Service: Obstetrics;  Laterality: N/A;    Family History:  History reviewed. No pertinent family history.   Social History:  Social History   Socioeconomic History   Marital status: Married    Spouse name: Not on file   Number of children: Not on file   Years of education: Not on file   Highest education level: Not on file  Occupational History   Not on file  Tobacco Use   Smoking status: Former    Types: Cigarettes    Quit date: 12/17/2018    Years since quitting: 3.0   Smokeless tobacco: Never  Vaping Use   Vaping Use: Never used  Substance and Sexual  Activity   Alcohol use: Not Currently   Drug use: Never   Sexual activity: Yes    Birth control/protection: None    Comment: last sexual encounter 2 weeks ago  Other Topics Concern   Not on file  Social History Narrative   ** Merged History Encounter **       ** Merged History Encounter **       Social Determinants of Health   Financial Resource Strain: Not on file  Food Insecurity: Not on file  Transportation Needs: Not on file  Physical Activity: Not on file  Stress: Not on file  Social Connections: Not on file  Intimate Partner Violence: Not on file    Allergy: Allergies  Allergen Reactions   Morphine And Related     Reports sweating, SOB and lightheaded    Morphine And Related Nausea And Vomiting    Current Outpatient Medications: No medications prior to admission.     Hospital Medications: Current Facility-Administered Medications  Medication Dose Route Frequency Provider Last Rate Last Admin   0.9 %  sodium chloride infusion   Intravenous Continuous 02/16/2019, MD       doxycycline (VIBRAMYCIN) 200 mg in dextrose 5 % 250 mL IVPB  200 mg Intravenous Once  [place] Bing, MD       lactated ringers infusion   Intravenous Continuous Gaynelle Adu, MD 10 mL/hr at 01/03/22 0800 New Bag at 01/03/22 0800     Physical Exam:  Current Vital Signs 24h Vital Sign Ranges  T 98.4 F (36.9 C) Temp  Avg: 98.4 F (36.9 C)  Min: 98.4 F (36.9 C)  Max: 98.4 F (36.9 C)  BP 118/78 BP  Min: 118/78  Max: 118/78  HR 94 Pulse  Avg: 94  Min: 94  Max: 94  RR 16 Resp  Avg: 16  Min: 16  Max: 16  SaO2 98 %   SpO2  Avg: 98 %  Min: 98 %  Max: 98 %       24 Hour I/O Current Shift I/O  Time Ins Outs No intake/output data recorded. No intake/output data recorded.    Body mass index is 30.02 kg/m. General appearance: Well nourished, well developed female in no acute distress.  Cardiovascular: S1, S2 normal, no murmur, rub or gallop, regular rate and  rhythm Respiratory:  Clear to auscultation bilateral. Normal respiratory effort Abdomen: positive bowel sounds and no masses, hernias; diffusely non tender to palpation, non distended Neuro/Psych:  Normal mood and affect.  Skin:  Warm and dry.  Extremities: no clubbing, cyanosis, or edema.    Laboratory:  Recent Labs  Lab 01/03/22 0807  WBC 8.6  HGB 15.4*  HCT 45.4  PLT 251   No results for input(s): NA, K, CL, CO2, BUN, CREATININE, CALCIUM, PROT, BILITOT, ALKPHOS, ALT, AST, GLUCOSE in the last 168 hours.  Invalid input(s): LABALBU No results for input(s): APTT, INR, PTT in the last 168 hours.  Invalid input(s): DRHAPTT Recent Labs  Lab 01/03/22 0800  ABORH O POS    Imaging:  Narrative & Impression  CLINICAL DATA:  Vaginal bleeding and cramping. Passing clots. LMP: 09/23/2021 corresponding to an estimated gestational age of [redacted] weeks, 1 day.   EXAM: TWIN OBSTETRIC <14WK [place] AND TRANSVAGINAL OB US   TECHNIQUE: Both transabdominal and transvaginal ultrasound examinations were performed for complete evaluation of the gestation as well as the maternal uterus, adnexal regions, and pelvic cul-de-sac. Transvaginal technique was performed to assess early pregnancy.   COMPARISON:  None Available.   FINDINGS: Number of IUPs:  2   Chorionicity/Amnionicity:  Monochorionic-diamniotic (thin membrane)   TWIN 1   Yolk sac:  Not seen   Embryo:  Present   Cardiac Activity: Not detected   CRL:  22 mm   8 w 5 d                  [place] EDC: 08/07/2022   TWIN 2   Yolk sac:  Not seen   Embryo:  Present   Cardiac Activity: Not detected   CRL:  29 mm   9 w 5 d                  [place] EDC: 07/31/2022   Subchorionic hemorrhage:  None visualized.   Maternal uterus/adnexae: The maternal ovaries are unremarkable.   IMPRESSION: Twin intrauterine pregnancy. No embryonic cardiac activity detected in either twin.     Electronically Signed   By: Elgie Collard M.D.   On:  12/31/2021 22:00    Assessment: Ms. Amy Welch is a 36 y.o. G2P1001 (Patient's last menstrual period was 09/23/2021 (approximate).) here for scheduled surgery for miscarriage of twins; pt stable  Plan: D/w her that most likely reason for miscarriage was due to  AMA and reasons for surgical management which she is still amenable to after risks of bleeding, infection, need for additional procedures d/w her.   Will send in abx for her UTI that was dx on 5/21  Can proceed to OR when they are ready  In person interpreter used  Cornelia Copaharlie Maddyn Lieurance, Jr. MD Attending Center for [facility](Faculty Practice)

## 2022-01-03 NOTE — Anesthesia Preprocedure Evaluation (Addendum)
Anesthesia Evaluation  Patient identified by MRN, date of birth, ID band Patient awake    Reviewed: Allergy & Precautions, H&P , NPO status , Patient's Chart, lab work & pertinent test results  Airway Mallampati: II  TM Distance: >3 FB Neck ROM: Full    Dental no notable dental hx. (+) Teeth Intact, Dental Advisory Given   Pulmonary neg pulmonary ROS, former smoker,    Pulmonary exam normal breath sounds clear to auscultation       Cardiovascular negative cardio ROS   Rhythm:Regular Rate:Normal     Neuro/Psych  Headaches, negative psych ROS   GI/Hepatic negative GI ROS, Neg liver ROS,   Endo/Other  negative endocrine ROS  Renal/GU negative Renal ROS  negative genitourinary   Musculoskeletal   Abdominal   Peds  Hematology  (+) Blood dyscrasia, anemia ,   Anesthesia Other Findings   Reproductive/Obstetrics negative OB ROS                            Anesthesia Physical Anesthesia Plan  ASA: 2  Anesthesia Plan: General   Post-op Pain Management: Tylenol PO (pre-op)* and Toradol IV (intra-op)*   Induction: Intravenous  PONV Risk Score and Plan: 4 or greater and Ondansetron, Dexamethasone and Midazolam  Airway Management Planned: LMA  Additional Equipment:   Intra-op Plan:   Post-operative Plan: Extubation in OR  Informed Consent: I have reviewed the patients History and Physical, chart, labs and discussed the procedure including the risks, benefits and alternatives for the proposed anesthesia with the patient or authorized representative who has indicated his/her understanding and acceptance.     Dental advisory given  Plan Discussed with: CRNA  Anesthesia Plan Comments:         Anesthesia Quick Evaluation

## 2022-01-03 NOTE — Discharge Instructions (Addendum)
You also have a UTI that was diagnosed in the Emergency Room. I have sent in antibiotics to the pharmacy for you for this.

## 2022-01-03 NOTE — Transfer of Care (Signed)
Immediate Anesthesia Transfer of Care Note  Patient: Amy Welch  Procedure(s) Performed: DILATATION AND EVACUATION (Vagina ) OPERATIVE ULTRASOUND (Abdomen)  Patient Location: PACU  Anesthesia Type:General  Level of Consciousness: awake, drowsy and patient cooperative  Airway & Oxygen Therapy: Patient Spontanous Breathing and Patient connected to face mask oxygen  Post-op Assessment: Report given to RN and Post -op Vital signs reviewed and stable  Post vital signs: Reviewed and stable  Last Vitals:  Vitals Value Taken Time  BP 118/67 01/03/22 1041  Temp    Pulse 81 01/03/22 1043  Resp 13 01/03/22 1043  SpO2 100 % 01/03/22 1043  Vitals shown include unvalidated device data.  Last Pain:  Vitals:   01/03/22 0756  TempSrc: Oral  PainSc: 2          Complications: No notable events documented.

## 2022-01-03 NOTE — Op Note (Signed)
Operative Note   01/03/2022  PRE-OP DIAGNOSIS: IUFD x 2 of twin pregnancy at 9-10wks   POST-OP DIAGNOSIS: Same. Abnormal cervix  SURGEON: Surgeon(s) and Role:    * Maahi Lannan, Billey Gosling, MD - Primary  ASSISTANT: None  PROCEDURE:  Suction dilation and curettage via ultrasound guidance  ANESTHESIA: General and paracervical block  ESTIMATED BLOOD LOSS:  DRAINS: None  TOTAL IV FLUIDS: per anesthesia note  SPECIMENS: products of conception to pathology  VTE PROPHYLAXIS: SCDs to the bilateral lower extremities  ANTIBIOTICS: Doxycycline 200mg  IV x 1 pre op  COMPLICATIONS: none  DISPOSITION: PACU - hemodynamically stable.  CONDITION: stable  BLOOD TYPE: O POS. Rhogam given:not applicable  FINDINGS: Exam under anesthesia revealed 10-12 week sized uterus with no masses and bilateral adnexa without masses or fullness. IUFD x 2 seen pre-operatively. Large amount of products of conception were seen. There was a gritty texture in all four quadrants at the end of the procedure, as well as thin and avascular endometrial stripe.   PROCEDURE IN DETAIL:  After informed consent was obtained, the patient was taken to the operating room where anesthesia was obtained without difficulty. The patient was positioned in the dorsal lithotomy position in [place] stirrups. The patient was examined under anesthesia, with the above noted findings.  The bi-valved speculum was placed inside the patient's vagina, and the the anterior lip of the cervix was seen and grasped with the tenaculum.  A paracervical block was achieved with 26mL of 1% lidocaine and then the cervix was easily dilated to a 31 french pratt dilator. The suction was then calibrated to 30m and connected to the number 12 cannula, which was then introduced with the above noted findings; ultrasound guidance was done during the entire procedure. A gentle curettage was done at the end and yield no products of conception.   The suction was then done  one more time to remove any remaining curettage material.   Excellent hemostasis was noted, and all instruments were removed, with excellent hemostasis noted throughout.  She was then taken out of dorsal lithotomy. The patient tolerated the procedure well.  Sponge, lap and instrument counts were correct x2.  The patient was taken to recovery room in excellent condition.  MD Attending Center for [facility] C[facility] [employer])

## 2022-01-03 NOTE — Brief Op Note (Signed)
01/03/2022  10:31 AM  PATIENT:  Amy Welch  36 y.o. female  PRE-OPERATIVE DIAGNOSIS:  missed ab  POST-OPERATIVE DIAGNOSIS:  missed ab  PROCEDURE:  Procedure(s): DILATATION AND EVACUATION (N/A) OPERATIVE ULTRASOUND (N/A)  SURGEON:  Surgeon(s) and Role:    * [place] Bing, MD - Primary  PHYSICIAN ASSISTANT:   ASSISTANTS: none   ANESTHESIA:   general and paracervical block  EBL:    BLOOD ADMINISTERED:none  DRAINS:  none    LOCAL MEDICATIONS USED:  LIDOCAINE   SPECIMEN:  products of conception  DISPOSITION OF SPECIMEN:  PATHOLOGY  COUNTS:  YES  TOURNIQUET:  * No tourniquets in log *  DICTATION: .Note written in EPIC  PLAN OF CARE: Discharge to home after PACU  PATIENT DISPOSITION:  PACU - hemodynamically stable.   Delay start of Pharmacological VTE agent (>24hrs) due to surgical blood loss or risk of bleeding: not applicable  Cornelia Copa MD Attending Center for [employer] [facility])

## 2022-01-03 NOTE — Anesthesia Postprocedure Evaluation (Signed)
Anesthesia Post Note  Patient: Amy Welch  Procedure(s) Performed: DILATATION AND EVACUATION (Vagina ) OPERATIVE ULTRASOUND (Abdomen)     Patient location during evaluation: PACU Anesthesia Type: General Level of consciousness: awake and alert Pain management: pain level controlled Vital Signs Assessment: post-procedure vital signs reviewed and stable Respiratory status: spontaneous breathing, nonlabored ventilation and respiratory function stable Cardiovascular status: blood pressure returned to baseline and stable Postop Assessment: no apparent nausea or vomiting Anesthetic complications: no   No notable events documented.  Last Vitals:  Vitals:   01/03/22 1055 01/03/22 1110  BP: 118/82 108/76  Pulse: 79 72  Resp: 14 15  Temp:  36.8 C  SpO2: 97% 98%    Last Pain:  Vitals:   01/03/22 1110  TempSrc:   PainSc: 0-No pain                 Dru Laurel,W. EDMOND

## 2022-01-04 ENCOUNTER — Encounter (HOSPITAL_COMMUNITY): Payer: Self-pay | Admitting: Obstetrics and Gynecology

## 2022-01-04 LAB — SURGICAL PATHOLOGY

## 2022-02-05 ENCOUNTER — Encounter: Payer: Self-pay | Admitting: Obstetrics and Gynecology

## 2022-02-05 ENCOUNTER — Ambulatory Visit (INDEPENDENT_AMBULATORY_CARE_PROVIDER_SITE_OTHER): Payer: Self-pay | Admitting: Obstetrics and Gynecology

## 2022-02-05 VITALS — BP 100/63 | HR 74 | Wt 176.2 lb

## 2022-02-05 DIAGNOSIS — Z789 Other specified health status: Secondary | ICD-10-CM

## 2022-02-05 DIAGNOSIS — Z09 Encounter for follow-up examination after completed treatment for conditions other than malignant neoplasm: Secondary | ICD-10-CM

## 2022-02-05 DIAGNOSIS — Z603 Acculturation difficulty: Secondary | ICD-10-CM

## 2022-02-05 MED ORDER — NORGESTIMATE-ETH ESTRADIOL 0.25-35 MG-MCG PO TABS: 1.0000 | ORAL_TABLET | Freq: Every day | ORAL | 3 refills | Status: AC

## 2022-04-24 ENCOUNTER — Ambulatory Visit: Payer: Self-pay
